# Patient Record
Sex: Female | Born: 1980 | Race: Black or African American | Hispanic: No | Marital: Single | Smoking: Current some day smoker
Health system: Southern US, Community
[De-identification: ages and names within clinical notes are randomized; demographics above are authoritative.]

## PROBLEM LIST (undated history)

## (undated) ENCOUNTER — Inpatient Hospital Stay (HOSPITAL_COMMUNITY): Payer: Self-pay

## (undated) DIAGNOSIS — E119 Type 2 diabetes mellitus without complications: Secondary | ICD-10-CM

## (undated) HISTORY — DX: Type 2 diabetes mellitus without complications: E11.9

## (undated) HISTORY — PX: LEG SURGERY: SHX1003

## (undated) HISTORY — PX: ANKLE SURGERY: SHX546

## (undated) HISTORY — PX: HIP SURGERY: SHX245

---

## 2002-06-14 DIAGNOSIS — E119 Type 2 diabetes mellitus without complications: Secondary | ICD-10-CM

## 2002-06-14 HISTORY — DX: Type 2 diabetes mellitus without complications: E11.9

## 2004-05-03 ENCOUNTER — Emergency Department (HOSPITAL_COMMUNITY): Admission: EM | Admit: 2004-05-03 | Discharge: 2004-05-03 | Payer: Self-pay | Admitting: *Deleted

## 2007-03-05 ENCOUNTER — Emergency Department (HOSPITAL_COMMUNITY): Admission: EM | Admit: 2007-03-05 | Discharge: 2007-03-05 | Payer: Self-pay | Admitting: Emergency Medicine

## 2008-08-06 ENCOUNTER — Inpatient Hospital Stay (HOSPITAL_COMMUNITY): Admission: AD | Admit: 2008-08-06 | Discharge: 2008-08-06 | Payer: Self-pay | Admitting: Obstetrics & Gynecology

## 2008-11-17 ENCOUNTER — Ambulatory Visit: Payer: Self-pay | Admitting: Advanced Practice Midwife

## 2008-11-17 ENCOUNTER — Inpatient Hospital Stay (HOSPITAL_COMMUNITY): Admission: AD | Admit: 2008-11-17 | Discharge: 2008-11-17 | Payer: Self-pay | Admitting: Obstetrics & Gynecology

## 2008-12-25 ENCOUNTER — Ambulatory Visit: Payer: Self-pay | Admitting: Obstetrics & Gynecology

## 2008-12-25 ENCOUNTER — Encounter: Payer: Self-pay | Admitting: Obstetrics and Gynecology

## 2008-12-25 LAB — CONVERTED CEMR LAB
AST: 9 units/L (ref 0–37)
Albumin: 3.3 g/dL — ABNORMAL LOW (ref 3.5–5.2)
Alkaline Phosphatase: 72 units/L (ref 39–117)
CO2: 21 meq/L (ref 19–32)
Chlamydia, DNA Probe: NEGATIVE
Creatinine, Ser: 0.75 mg/dL (ref 0.40–1.20)
Glucose, Bld: 76 mg/dL (ref 70–99)
LDH: 148 units/L (ref 94–250)
Platelets: 262 10*3/uL (ref 150–400)
Potassium: 4.3 meq/L (ref 3.5–5.3)
RDW: 13.7 % (ref 11.5–15.5)
TSH: 1.46 microintl units/mL (ref 0.350–4.500)
WBC: 10.8 10*3/uL — ABNORMAL HIGH (ref 4.0–10.5)

## 2008-12-26 ENCOUNTER — Ambulatory Visit: Payer: Self-pay | Admitting: Obstetrics & Gynecology

## 2008-12-26 ENCOUNTER — Encounter: Payer: Self-pay | Admitting: Obstetrics and Gynecology

## 2008-12-26 ENCOUNTER — Ambulatory Visit (HOSPITAL_COMMUNITY): Admission: RE | Admit: 2008-12-26 | Discharge: 2008-12-26 | Payer: Self-pay | Admitting: Obstetrics & Gynecology

## 2008-12-26 LAB — CONVERTED CEMR LAB
Collection Interval-CRCL: 24 hr
Creatinine, Urine: 170.8 mg/dL
Protein, Ur: 285 mg/24hr — ABNORMAL HIGH (ref 50–100)

## 2008-12-30 ENCOUNTER — Ambulatory Visit: Payer: Self-pay | Admitting: Obstetrics & Gynecology

## 2008-12-30 ENCOUNTER — Encounter: Payer: Self-pay | Admitting: Obstetrics and Gynecology

## 2008-12-30 ENCOUNTER — Encounter: Payer: Self-pay | Admitting: Family

## 2008-12-30 ENCOUNTER — Encounter: Admission: RE | Admit: 2008-12-30 | Discharge: 2009-01-13 | Payer: Self-pay | Admitting: Obstetrics & Gynecology

## 2008-12-30 LAB — CONVERTED CEMR LAB
Trich, Wet Prep: NONE SEEN
Yeast Wet Prep HPF POC: NONE SEEN

## 2009-01-02 ENCOUNTER — Ambulatory Visit (HOSPITAL_COMMUNITY): Admission: RE | Admit: 2009-01-02 | Discharge: 2009-01-02 | Payer: Self-pay | Admitting: Obstetrics & Gynecology

## 2009-01-02 ENCOUNTER — Ambulatory Visit: Payer: Self-pay | Admitting: Obstetrics & Gynecology

## 2009-01-06 ENCOUNTER — Ambulatory Visit: Payer: Self-pay | Admitting: Obstetrics & Gynecology

## 2009-01-09 ENCOUNTER — Ambulatory Visit (HOSPITAL_COMMUNITY): Admission: RE | Admit: 2009-01-09 | Discharge: 2009-01-09 | Payer: Self-pay | Admitting: Family Medicine

## 2009-01-09 ENCOUNTER — Ambulatory Visit: Payer: Self-pay | Admitting: Obstetrics & Gynecology

## 2009-01-13 ENCOUNTER — Ambulatory Visit: Payer: Self-pay | Admitting: Obstetrics & Gynecology

## 2009-01-16 ENCOUNTER — Ambulatory Visit: Payer: Self-pay | Admitting: Obstetrics & Gynecology

## 2009-01-20 ENCOUNTER — Inpatient Hospital Stay (HOSPITAL_COMMUNITY): Admission: AD | Admit: 2009-01-20 | Discharge: 2009-01-24 | Payer: Self-pay | Admitting: Obstetrics and Gynecology

## 2009-01-20 ENCOUNTER — Ambulatory Visit: Payer: Self-pay | Admitting: Advanced Practice Midwife

## 2009-11-19 ENCOUNTER — Emergency Department (HOSPITAL_BASED_OUTPATIENT_CLINIC_OR_DEPARTMENT_OTHER): Admission: EM | Admit: 2009-11-19 | Discharge: 2009-11-20 | Payer: Self-pay | Admitting: Emergency Medicine

## 2009-11-20 ENCOUNTER — Ambulatory Visit: Payer: Self-pay | Admitting: Diagnostic Radiology

## 2010-09-19 LAB — POCT URINALYSIS DIP (DEVICE)
Bilirubin Urine: NEGATIVE
Bilirubin Urine: NEGATIVE
Glucose, UA: NEGATIVE mg/dL
Nitrite: NEGATIVE
Protein, ur: >=300 mg/dL — AB
Specific Gravity, Urine: >=1.03 (ref 1.005–1.030)
Urobilinogen, UA: 0.2 mg/dL (ref 0.0–1.0)
pH: 6 (ref 5.0–8.0)
pH: 6.5 (ref 5.0–8.0)

## 2010-09-19 LAB — CBC
HCT: 31.4 % — ABNORMAL LOW (ref 36.0–46.0)
HCT: 32.1 % — ABNORMAL LOW (ref 36.0–46.0)
Hemoglobin: 10.7 g/dL — ABNORMAL LOW (ref 12.0–15.0)
Hemoglobin: 10.9 g/dL — ABNORMAL LOW (ref 12.0–15.0)
MCHC: 33.9 g/dL (ref 30.0–36.0)
MCHC: 34 g/dL (ref 30.0–36.0)
MCV: 97.2 fL (ref 78.0–100.0)
MCV: 98.8 fL (ref 78.0–100.0)
Platelets: 242 10*3/uL (ref 150–400)
RBC: 3.23 MIL/uL — ABNORMAL LOW (ref 3.87–5.11)
RDW: 13.6 % (ref 11.5–15.5)
RDW: 13.7 % (ref 11.5–15.5)
RDW: 13.9 % (ref 11.5–15.5)

## 2010-09-19 LAB — GLUCOSE, CAPILLARY
Glucose-Capillary: 104 mg/dL — ABNORMAL HIGH (ref 70–99)
Glucose-Capillary: 61 mg/dL — ABNORMAL LOW (ref 70–99)
Glucose-Capillary: 81 mg/dL (ref 70–99)

## 2010-09-19 LAB — COMPREHENSIVE METABOLIC PANEL
BUN: 10 mg/dL (ref 6–23)
Chloride: 108 mEq/L (ref 96–112)
GFR calc Af Amer: >60 mL/min (ref >60)
Potassium: 4 mEq/L (ref 3.5–5.1)
Sodium: 137 mEq/L (ref 135–145)

## 2010-09-19 LAB — URINALYSIS, DIPSTICK ONLY
Bilirubin Urine: NEGATIVE
Nitrite: NEGATIVE
Urobilinogen, UA: 0.2 mg/dL (ref 0.0–1.0)
pH: 6 (ref 5.0–8.0)

## 2010-09-19 LAB — RPR: RPR Ser Ql: NONREACTIVE

## 2010-09-20 LAB — POCT URINALYSIS DIP (DEVICE)
Bilirubin Urine: NEGATIVE
Bilirubin Urine: NEGATIVE
Glucose, UA: NEGATIVE mg/dL
Nitrite: NEGATIVE
Protein, ur: 100 mg/dL — AB
Protein, ur: >=300 mg/dL — AB
Specific Gravity, Urine: 1.025 (ref 1.005–1.030)
Specific Gravity, Urine: >=1.03 (ref 1.005–1.030)
Urobilinogen, UA: 0.2 mg/dL (ref 0.0–1.0)
pH: 6 (ref 5.0–8.0)

## 2010-09-21 LAB — WET PREP, GENITAL

## 2010-09-29 LAB — URINALYSIS, ROUTINE W REFLEX MICROSCOPIC
Hgb urine dipstick: NEGATIVE
Specific Gravity, Urine: 1.025 (ref 1.005–1.030)
pH: 6 (ref 5.0–8.0)

## 2010-09-29 LAB — URINE MICROSCOPIC-ADD ON

## 2012-05-02 ENCOUNTER — Encounter (HOSPITAL_BASED_OUTPATIENT_CLINIC_OR_DEPARTMENT_OTHER): Payer: Self-pay

## 2012-05-02 ENCOUNTER — Emergency Department (HOSPITAL_BASED_OUTPATIENT_CLINIC_OR_DEPARTMENT_OTHER): Payer: Medicaid Other

## 2012-05-02 ENCOUNTER — Emergency Department (HOSPITAL_BASED_OUTPATIENT_CLINIC_OR_DEPARTMENT_OTHER)
Admission: EM | Admit: 2012-05-02 | Discharge: 2012-05-03 | Disposition: A | Discharge status: Home / Self Care | Payer: Medicaid Other | Attending: Emergency Medicine | Admitting: Emergency Medicine

## 2012-05-02 DIAGNOSIS — S335XXA Sprain of ligaments of lumbar spine, initial encounter: Secondary | ICD-10-CM | POA: Insufficient documentation

## 2012-05-02 DIAGNOSIS — Y9389 Activity, other specified: Secondary | ICD-10-CM | POA: Insufficient documentation

## 2012-05-02 DIAGNOSIS — S39012A Strain of muscle, fascia and tendon of lower back, initial encounter: Secondary | ICD-10-CM

## 2012-05-02 LAB — PREGNANCY, URINE: Preg Test, Ur: NEGATIVE
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
## 2012-05-03 MED ORDER — METHOCARBAMOL 500 MG PO TABS: 500.0000 mg | ORAL_TABLET | Freq: Two times a day (BID) | ORAL | Status: DC

## 2012-05-03 MED ORDER — TRAMADOL HCL 50 MG PO TABS: 50.0000 mg | ORAL_TABLET | Freq: Four times a day (QID) | ORAL | Status: DC | PRN
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
## 2013-06-04 ENCOUNTER — Emergency Department (HOSPITAL_COMMUNITY): Payer: Medicaid Other

## 2013-06-04 ENCOUNTER — Emergency Department (HOSPITAL_COMMUNITY)
Admission: EM | Admit: 2013-06-04 | Discharge: 2013-06-04 | Disposition: A | Discharge status: Home / Self Care | Payer: Medicaid Other | Attending: Emergency Medicine | Admitting: Emergency Medicine

## 2013-06-04 ENCOUNTER — Encounter (HOSPITAL_COMMUNITY): Payer: Self-pay | Admitting: Emergency Medicine

## 2013-06-04 DIAGNOSIS — R42 Dizziness and giddiness: Secondary | ICD-10-CM | POA: Insufficient documentation

## 2013-06-04 DIAGNOSIS — B9789 Other viral agents as the cause of diseases classified elsewhere: Secondary | ICD-10-CM | POA: Insufficient documentation

## 2013-06-04 DIAGNOSIS — B349 Viral infection, unspecified: Secondary | ICD-10-CM

## 2013-06-04 DIAGNOSIS — M542 Cervicalgia: Secondary | ICD-10-CM | POA: Insufficient documentation

## 2013-06-04 DIAGNOSIS — H53149 Visual discomfort, unspecified: Secondary | ICD-10-CM | POA: Insufficient documentation

## 2013-06-04 DIAGNOSIS — J069 Acute upper respiratory infection, unspecified: Secondary | ICD-10-CM | POA: Insufficient documentation

## 2013-06-04 DIAGNOSIS — Z3202 Encounter for pregnancy test, result negative: Secondary | ICD-10-CM | POA: Insufficient documentation

## 2013-06-04 DIAGNOSIS — IMO0001 Reserved for inherently not codable concepts without codable children: Secondary | ICD-10-CM | POA: Insufficient documentation

## 2013-06-04 LAB — COMPREHENSIVE METABOLIC PANEL
Alkaline Phosphatase: 63 U/L (ref 39–117)
BUN: 6 mg/dL (ref 6–23)
Chloride: 101 mEq/L (ref 96–112)
Creatinine, Ser: 0.95 mg/dL (ref 0.50–1.10)
GFR calc Af Amer: >90 mL/min (ref >90)
Glucose, Bld: 81 mg/dL (ref 70–99)
Potassium: 3.2 mEq/L — ABNORMAL LOW (ref 3.5–5.1)
Total Bilirubin: 0.3 mg/dL (ref 0.3–1.2)
Total Protein: 7.5 g/dL (ref 6.0–8.3)

## 2013-06-04 LAB — CBC WITH DIFFERENTIAL/PLATELET
Eosinophils Absolute: 0 10*3/uL (ref 0.0–0.7)
Lymphocytes Relative: 35 % (ref 12–46)
MCHC: 33.9 g/dL (ref 30.0–36.0)
MCV: 94.3 fL (ref 78.0–100.0)
Monocytes Absolute: 0.8 10*3/uL (ref 0.1–1.0)
Neutro Abs: 2.4 10*3/uL (ref 1.7–7.7)
Platelets: 288 10*3/uL (ref 150–400)
RDW: 14.2 % (ref 11.5–15.5)
WBC: 4.9 10*3/uL (ref 4.0–10.5)

## 2013-06-04 LAB — URINALYSIS, ROUTINE W REFLEX MICROSCOPIC
Ketones, ur: 40 mg/dL — AB
Leukocytes, UA: NEGATIVE
Nitrite: NEGATIVE
Protein, ur: NEGATIVE mg/dL

## 2013-06-04 MED ORDER — SODIUM CHLORIDE 0.9 % IV BOLUS (SEPSIS): 500.0000 mL | Freq: Once | INTRAVENOUS | Status: DC

## 2013-06-04 MED ORDER — ACETAMINOPHEN 325 MG PO TABS
650.0000 mg | ORAL_TABLET | Freq: Once | ORAL | Status: AC
  Administered 2013-06-04: 650 mg via ORAL
  Filled 2013-06-04: qty 2

## 2013-06-04 MED ORDER — METOCLOPRAMIDE HCL 5 MG/ML IJ SOLN
10.0000 mg | Freq: Once | INTRAMUSCULAR | Status: AC
  Administered 2013-06-04: 10 mg via INTRAVENOUS
  Filled 2013-06-04: qty 2

## 2013-06-04 MED ORDER — KETOROLAC TROMETHAMINE 30 MG/ML IJ SOLN
30.0000 mg | Freq: Once | INTRAMUSCULAR | Status: AC
  Administered 2013-06-04: 30 mg via INTRAVENOUS
  Filled 2013-06-04: qty 1

## 2013-06-04 MED ORDER — SODIUM CHLORIDE 0.9 % IV BOLUS (SEPSIS)
1000.0000 mL | Freq: Once | INTRAVENOUS | Status: AC
  Administered 2013-06-04: 1000 mL via INTRAVENOUS

## 2013-06-04 MED ORDER — POTASSIUM CHLORIDE CRYS ER 20 MEQ PO TBCR
40.0000 meq | EXTENDED_RELEASE_TABLET | Freq: Once | ORAL | Status: AC
  Administered 2013-06-04: 40 meq via ORAL
  Filled 2013-06-04: qty 2

## 2013-06-04 NOTE — ED Notes (Signed)
Droplet precaution signed placed on door. Pt made aware staff will be wearing masks in room.

## 2013-06-04 NOTE — ED Notes (Signed)
Pt attempted to urinate. Came back to room, sts she is still unable to.

## 2013-06-04 NOTE — ED Provider Notes (Signed)
CSN: [id number]     Arrival date & time 06/04/13  0830 History   First MD Initiated Contact with Patient 06/04/13 0845     Chief Complaint  Patient presents with  . Cough  . Headache  . Neck Pain   (Consider location/radiation/quality/duration/timing/severity/associated sxs/prior Treatment) The history is provided by the patient. No language interpreter was used.  Kathleen Dixon is a 32 year old female with no significant past medical history presenting to emergency department with headache, chills, nonproductive cough, nasal congestion, generalized body aches, sneezing that started yesterday. Patient reports that the headache is localized to the right side of her head described as a throbbing sensation that is constant and has gradually gotten worse starting yesterday. Reported that she's sensitive to light. Denied radiation. Reports she's been feeling mildly dizzy with room spinning sensations. Reported that she's not been drinking as much or eating as much, reported that she is decreasing urine secondary to not drinking a lot of fluids lately. Reports she's Mucinex yesterday and her stating her congestion with minimal relief. Reports that her child at home is currently ill with nausea, vomiting and fever. Patient reports that she was feeling chills intermittently-sweats and cold. Denied neck pain, neck stiffness, nausea, vomiting, melena, hematochezia, abdominal pain, blurred vision, sudden loss of vision, chest pain, shortness of breath, difficulty breathing. PCP none  History reviewed. No pertinent past medical history. Past Surgical History  Procedure Laterality Date  . Hip surgery    . Leg surgery    . Ankle surgery     No family history on file. History  Substance Use Topics  . Smoking status: Never Smoker   . Smokeless tobacco: Not on file  . Alcohol Use: No   OB History   Grav Para Term Preterm Abortions TAB SAB Ect Mult Living                 Review of Systems   Constitutional: Positive for fever and chills. Negative for diaphoresis.  HENT: Positive for congestion. Negative for sore throat and trouble swallowing.   Eyes: Positive for photophobia. Negative for visual disturbance.  Respiratory: Positive for cough. Negative for chest tightness and shortness of breath.   Cardiovascular: Negative for chest pain.  Gastrointestinal: Negative for nausea, vomiting, abdominal pain, diarrhea, constipation, blood in stool and anal bleeding.  Genitourinary: Negative for dysuria and hematuria.  Musculoskeletal: Positive for myalgias (Generalized). Negative for neck pain and neck stiffness.  Neurological: Positive for dizziness and headaches. Negative for weakness.  All other systems reviewed and are negative.    Allergies  Review of patient's allergies indicates no known allergies.  Home Medications   Current Outpatient Rx  Name  Route  Sig  Dispense  Refill  . guaiFENesin (MUCINEX) 600 MG 12 hr tablet   Oral   Take 1,200 mg by mouth 2 (two) times daily as needed for cough or to loosen phlegm.          BP 120/93  Pulse 73  Temp(Src) 99.1 F (37.3 C) (Oral)  Resp 18  Wt 268 lb (121.564 kg)  SpO2 99%  LMP 05/28/2013 Physical Exam  Nursing note and vitals reviewed. Constitutional: She is oriented to person, place, and time. She appears well-developed and well-nourished. No distress.  HENT:  Head: Normocephalic and atraumatic.  Mouth/Throat: Oropharynx is clear and moist. No oropharyngeal exudate.  Negative swelling, erythema, inflammation, lesions, sores, taking, exudate noted to posterior oropharynx and bilateral tonsils. Uvula midline, symmetrical elevation. Negative uvular swelling. Negative trismus.  Eyes: Conjunctivae and EOM are normal. Pupils are equal, round, and reactive to light. Right eye exhibits no discharge. Left eye exhibits no discharge.  Neck: Normal range of motion. Neck supple. No tracheal deviation present.  Negative neck  stiffness Negative nuchal rigidity Negative cervical lymphadenopathy Negative pain upon palpation to C-spine  Negative Kernig's Negative Brudzinski's  Cardiovascular: Normal rate, regular rhythm and normal heart sounds.   No murmur heard. Pulses:      Radial pulses are 2+ on the right side, and 2+ on the left side.       Dorsalis pedis pulses are 2+ on the right side, and 2+ on the left side.  Pulmonary/Chest: Effort normal and breath sounds normal. No respiratory distress. She has no wheezes. She has no rales. She exhibits no tenderness.  Musculoskeletal: Normal range of motion. She exhibits no tenderness.  Full ROM to upper and lower extremities bilaterally without difficulty noted  Lymphadenopathy:    She has no cervical adenopathy.  Neurological: She is alert and oriented to person, place, and time. No cranial nerve deficit. She exhibits normal muscle tone. Coordination normal.  Cranial nerves III-XII grossly intact Strength 5+/5+ to upper and lower extremities bilaterally with resistance applied, equal distribution noted Fine motor skills intact Heel to knee down shin normal bilaterally Gait proper, proper balance - negative sway, negative drift, negative step-offs    Skin: Skin is warm and dry. No rash noted. She is not diaphoretic. No erythema.  Psychiatric: She has a normal mood and affect. Her behavior is normal.    ED Course  Procedures (including critical care time)  1:17 PM Patient walking around the hospital. Patient appears well. Patient ready to go home. Reported that her headache is improved.  Results for orders placed during the hospital encounter of 06/04/13  CBC WITH DIFFERENTIAL      Result Value Range   WBC 4.9  4.0 - 10.5 K/uL   RBC 4.04  3.87 - 5.11 MIL/uL   Hemoglobin 12.9  12.0 - 15.0 g/dL   HCT 16.1  09.6 - 04.5 %   MCV 94.3  78.0 - 100.0 fL   MCH 31.9  26.0 - 34.0 pg   MCHC 33.9  30.0 - 36.0 g/dL   RDW 40.9  81.1 - 91.4 %   Platelets 288  150 -  400 K/uL   Neutrophils Relative % 49  43 - 77 %   Neutro Abs 2.4  1.7 - 7.7 K/uL   Lymphocytes Relative 35  12 - 46 %   Lymphs Abs 1.7  0.7 - 4.0 K/uL   Monocytes Relative 16 (*) 3 - 12 %   Monocytes Absolute 0.8  0.1 - 1.0 K/uL   Eosinophils Relative 0  0 - 5 %   Eosinophils Absolute 0.0  0.0 - 0.7 K/uL   Basophils Relative 0  0 - 1 %   Basophils Absolute 0.0  0.0 - 0.1 K/uL  COMPREHENSIVE METABOLIC PANEL      Result Value Range   Sodium 136  135 - 145 mEq/L   Potassium 3.2 (*) 3.5 - 5.1 mEq/L   Chloride 101  96 - 112 mEq/L   CO2 26  19 - 32 mEq/L   Glucose, Bld 81  70 - 99 mg/dL   BUN 6  6 - 23 mg/dL   Creatinine, Ser 7.82  0.50 - 1.10 mg/dL   Calcium 8.8  8.4 - 95.6 mg/dL   Total Protein 7.5  6.0 - 8.3 g/dL  Albumin 3.6  3.5 - 5.2 g/dL   AST 16  0 - 37 U/L   ALT 11  0 - 35 U/L   Alkaline Phosphatase 63  39 - 117 U/L   Total Bilirubin 0.3  0.3 - 1.2 mg/dL   GFR calc non Af Amer 79 (*) >90 mL/min   GFR calc Af Amer >90  >90 mL/min  URINALYSIS, ROUTINE W REFLEX MICROSCOPIC      Result Value Range   Color, Urine YELLOW  YELLOW   APPearance HAZY (*) CLEAR   Specific Gravity, Urine 1.023  1.005 - 1.030   pH 6.0  5.0 - 8.0   Glucose, UA NEGATIVE  NEGATIVE mg/dL   Hgb urine dipstick NEGATIVE  NEGATIVE   Bilirubin Urine NEGATIVE  NEGATIVE   Ketones, ur 40 (*) NEGATIVE mg/dL   Protein, ur NEGATIVE  NEGATIVE mg/dL   Urobilinogen, UA 1.0  0.0 - 1.0 mg/dL   Nitrite NEGATIVE  NEGATIVE   Leukocytes, UA NEGATIVE  NEGATIVE  PREGNANCY, URINE      Result Value Range   Preg Test, Ur NEGATIVE  NEGATIVE   Dg Chest 2 View  06/04/2013   CLINICAL DATA:  Cough and dizziness  EXAM: CHEST  2 VIEW  COMPARISON:  March 05, 2007  FINDINGS: Lungs are clear. Heart size and pulmonary vascularity are normal. No adenopathy. No pneumothorax. There is mild degenerative change in the thoracic spine.  IMPRESSION: No edema or consolidation.   Electronically Signed   By: Bretta Bang M.D.   On:  06/04/2013 10:20   Labs Review Labs Reviewed  CBC WITH DIFFERENTIAL - Abnormal; Notable for the following:    Monocytes Relative 16 (*)    All other components within normal limits  COMPREHENSIVE METABOLIC PANEL - Abnormal; Notable for the following:    Potassium 3.2 (*)    GFR calc non Af Amer 79 (*)    All other components within normal limits  URINALYSIS, ROUTINE W REFLEX MICROSCOPIC - Abnormal; Notable for the following:    APPearance HAZY (*)    Ketones, ur 40 (*)    All other components within normal limits  PREGNANCY, URINE  TROPONIN I   Imaging Review Dg Chest 2 View  06/04/2013   CLINICAL DATA:  Cough and dizziness  EXAM: CHEST  2 VIEW  COMPARISON:  March 05, 2007  FINDINGS: Lungs are clear. Heart size and pulmonary vascularity are normal. No adenopathy. No pneumothorax. There is mild degenerative change in the thoracic spine.  IMPRESSION: No edema or consolidation.   Electronically Signed   By: Bretta Bang M.D.   On: 06/04/2013 10:20    EKG Interpretation    Date/Time:  Monday June 04 2013 09:50:52 EST Ventricular Rate:  71 PR Interval:  182 QRS Duration: 89 QT Interval:  399 QTC Calculation: 434 R Axis:   -31 Text Interpretation:  Sinus rhythm Left axis deviation Low voltage, precordial leads Borderline T abnormalities, anterior leads No old tracing to compare Confirmed by GOLDSTON  MD, SCOTT ([id number]) on 06/04/2013 10:18:25 AM            MDM   1. Viral illness   2. URI (upper respiratory infection)     Medications  sodium chloride 0.9 % bolus 500 mL (not administered)  sodium chloride 0.9 % bolus 1,000 mL (0 mLs Intravenous Stopped 06/04/13 1100)  potassium chloride SA (K-DUR,KLOR-CON) CR tablet 40 mEq (40 mEq Oral Given 06/04/13 1041)  ketorolac (TORADOL) 30 MG/ML injection 30 mg (  30 mg Intravenous Given 06/04/13 1048)  acetaminophen (TYLENOL) tablet 650 mg (650 mg Oral Given 06/04/13 1047)  sodium chloride 0.9 % bolus 1,000 mL (1,000 mLs  Intravenous New Bag/Given 06/04/13 1240)  metoCLOPramide (REGLAN) injection 10 mg (10 mg Intravenous Given 06/04/13 1243)   Filed Vitals:   06/04/13 1130 06/04/13 1230 06/04/13 1241 06/04/13 1300  BP:   120/93   Pulse: 71 74 69 73  Temp:      TempSrc:      Resp:      Weight:      SpO2: 100% 100% 100% 99%    Patient presenting to emergency department with headache, chills, nonproductive cough, nasal congestion, generalized bodyaches, subjective fever that's been ongoing since yesterday. Reported that her child is ill with nausea, vomiting, and fever. Denied flu vaccine. Alert and oriented. GCS 15. Heart rate and rhythm normal. Pulses palpable strong, radial and DP 2+ bilaterally. Lungs clear to auscultation to upper and lower lobes bilaterally. Discomfort upon palpation to the right frontal and maxillary region. Negative nystagmus. Negative nuchal rigidity, negative pain upon palpation to the neck and C-spine. Negative Kernig's, negative Brudzinski's sign. Full range of motion to upper lower extremities bilaterally. Strength intact with equal distribution bilaterally. Cranial nerves grossly intact. Negative focal neurological deficits. Obese. CBC negative findings. CMP noted hypokalemia of 3.2-potassium by mouth administered. Urine pregnancy negative. Urinalysis negative for nitrites and leukocytes-negative for infection. Chest x-ray negative for acute abnormalities, negative findings for pneumonia. Doubt ICH. Doubt SAH. Doubt meningitis. Fever controlled in ED setting. Headache has improved with medications and IV fluids. Suspicion to be viral syndrome-upper respiratory infection. Patient stable, afebrile. Discharged patient. Discussed with patient to rest, stay hydrated. Referred patient to urgent care Center. Discussed with patient to take Tylenol as needed for discomfort. Discussed with patient to do warm soaks. Discussed with patient to closely monitor symptoms and if symptoms are to worsen or  change report back to emergency department - strict return structures given. Patient agreed to plan of care, understood, all questions answered.   Raymon Mutton, PA-C 06/04/13 [phone]

## 2013-06-04 NOTE — ED Notes (Signed)
Pt is here with headache, neck pain, fever since yesterday.  Aches all over

## 2013-06-04 NOTE — ED Notes (Signed)
Pt reports she is ready to leave. Marissa, PA made aware.

## 2013-06-04 NOTE — ED Notes (Signed)
Pt reports yesterday she got a HA then progressively started to feel worse with complaints of fever, body aches, cough, nasal congestion and runny nose, also having diarrhea. Denies n/v. Denies getting flu shot this year. C/o decreased appetite. Pt sts she took Mucinex yesterday, no relief. Nad, skin warm and dry, resp e/u.
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
## 2013-06-04 NOTE — ED Notes (Addendum)
Pt refusing in and out cath. Reports she doesn't feel good already and that's not going to help. Pt explained the importance of seeing if there is an infection in urine. Pt took urine specimen cup and sts she will try again.

## 2013-06-04 NOTE — ED Notes (Signed)
Pharmacy tech at bedside 

## 2013-06-04 NOTE — ED Notes (Signed)
Pt returned from radiology.

## 2013-06-04 NOTE — ED Notes (Signed)
Pt refusing last set of vitals

## 2013-06-04 NOTE — ED Notes (Signed)
Gave pt urine specimen cup to attempt to urinate. Pt ambulated to restroom with no issues.

## 2013-06-04 NOTE — ED Notes (Signed)
Pt reminded need for urine sample.  

## 2013-06-04 NOTE — ED Notes (Signed)
Pt returned from bathroom, reports she was unable to urinate.

## 2013-06-05 NOTE — ED Provider Notes (Signed)
Medical screening examination/treatment/procedure(s) were performed by non-physician practitioner and as supervising physician I was immediately available for consultation/collaboration.  EKG Interpretation    Date/Time:  Monday June 04 2013 09:50:52 EST Ventricular Rate:  71 PR Interval:  182 QRS Duration: 89 QT Interval:  399 QTC Calculation: 434 R Axis:   -31 Text Interpretation:  Sinus rhythm Left axis deviation Low voltage, precordial leads Borderline T abnormalities, anterior leads No old tracing to compare Confirmed by Lester Platas  MD, Arbadella Kimbler ([id number]) on 06/04/2013 10:18:25 AM              Audree Camel, MD 06/05/13 [phone]

## 2013-07-25 ENCOUNTER — Ambulatory Visit: Payer: Self-pay | Admitting: Obstetrics

## 2013-10-25 ENCOUNTER — Encounter: Payer: Self-pay | Admitting: Obstetrics

## 2013-10-25 ENCOUNTER — Ambulatory Visit (INDEPENDENT_AMBULATORY_CARE_PROVIDER_SITE_OTHER): Payer: Medicaid Other | Admitting: Obstetrics

## 2013-10-25 VITALS — BP 127/84 | HR 78 | Temp 98.7°F | Ht 70.0 in | Wt 309.0 lb

## 2013-10-25 DIAGNOSIS — Z0000 Encounter for general adult medical examination without abnormal findings: Secondary | ICD-10-CM

## 2013-10-25 DIAGNOSIS — B373 Candidiasis of vulva and vagina: Secondary | ICD-10-CM

## 2013-10-25 DIAGNOSIS — B9689 Other specified bacterial agents as the cause of diseases classified elsewhere: Secondary | ICD-10-CM | POA: Insufficient documentation

## 2013-10-25 DIAGNOSIS — N760 Acute vaginitis: Secondary | ICD-10-CM | POA: Insufficient documentation

## 2013-10-25 DIAGNOSIS — B3731 Acute candidiasis of vulva and vagina: Secondary | ICD-10-CM | POA: Insufficient documentation

## 2013-10-25 DIAGNOSIS — M5432 Sciatica, left side: Secondary | ICD-10-CM | POA: Insufficient documentation

## 2013-10-25 MED ORDER — NORGESTIMATE-ETH ESTRADIOL 0.25-35 MG-MCG PO TABS: 1.0000 | ORAL_TABLET | Freq: Every day | ORAL | Status: DC

## 2013-10-25 MED ORDER — CYCLOBENZAPRINE HCL 10 MG PO TABS: 10.0000 mg | ORAL_TABLET | Freq: Three times a day (TID) | ORAL | Status: DC | PRN

## 2013-10-25 MED ORDER — HYDROCODONE-ACETAMINOPHEN 7.5-325 MG PO TABS: 1.0000 | ORAL_TABLET | Freq: Four times a day (QID) | ORAL | Status: DC | PRN

## 2013-10-25 MED ORDER — IBUPROFEN 800 MG PO TABS: 800.0000 mg | ORAL_TABLET | Freq: Three times a day (TID) | ORAL | Status: DC | PRN

## 2013-10-25 MED ORDER — FLUCONAZOLE 150 MG PO TABS: 150.0000 mg | ORAL_TABLET | Freq: Once | ORAL | Status: DC

## 2013-10-25 MED ORDER — METRONIDAZOLE 500 MG PO TABS: 500.0000 mg | ORAL_TABLET | Freq: Two times a day (BID) | ORAL | Status: DC

## 2013-10-25 NOTE — Progress Notes (Signed)
Subjective:     Kathleen Dixon is a 33 y.o. female here for a routine exam.  Current complaints: None.    Personal health questionnaire:  Is patient Kathleen Dixon, have a family history of breast and/or ovarian cancer: no Is there a family history of uterine cancer diagnosed at age < 2750, gastrointestinal cancer, urinary tract cancer, family member who is a Personnel officerLynch syndrome-associated carrier: no Is the patient overweight and hypertensive, family history of diabetes, personal history of gestational diabetes or PCOS: no Is patient over 5955, have PCOS,  family history of premature CHD under age 32, diabetes, smoke, have hypertension or peripheral artery disease:  no  The HPI was reviewed and explored in further detail by the provider. Gynecologic History Patient's last menstrual period was 10/11/2013. Contraception: none Last Pap: 2013 . Results were: normal Last mammogram: n/a. Results were: n/a  Obstetric History OB History  Gravida Para Term Preterm AB SAB TAB Ectopic Multiple Living  1 1 1  0 0 0 0 0 0 1    # Outcome Date GA Lbr Len/2nd Weight Sex Delivery Anes PTL Lv  1 TRM 01/22/09 [id number]  6 lb 9 oz (2.977 kg) F SVD EPI  Y      History reviewed. No pertinent past medical history.  Past Surgical History  Procedure Laterality Date  . Hip surgery    . Leg surgery    . Ankle surgery      Current outpatient prescriptions:fluconazole (DIFLUCAN) 150 MG tablet, Take 1 tablet (150 mg total) by mouth once., Disp: 1 tablet, Rfl: 2;  metroNIDAZOLE (FLAGYL) 500 MG tablet, Take 1 tablet (500 mg total) by mouth 2 (two) times daily., Disp: 14 tablet, Rfl: 2 No Known Allergies  History  Substance Use Topics  . Smoking status: Current Some Day Smoker  . Smokeless tobacco: Never Used  . Alcohol Use: No    Family History  Problem Relation Age of Onset  . Diabetes Mother   . Hypertension Mother   . Diabetes Father   . Hypertension Father       Review of Systems  Constitutional:  negative for fatigue and weight loss Respiratory: negative for cough and wheezing Cardiovascular: negative for chest pain, fatigue and palpitations Gastrointestinal: negative for abdominal pain and change in bowel habits Musculoskeletal:negative for myalgias Neurological: negative for gait problems and tremors Behavioral/Psych: negative for abusive relationship, depression Endocrine: negative for temperature intolerance   Genitourinary:negative for abnormal menstrual periods, genital lesions, hot flashes, sexual problems and vaginal discharge Integument/breast: negative for breast lump, breast tenderness, nipple discharge and skin lesion(s)    Objective:       BP 127/84  Pulse 78  Temp(Src) 98.7 F (37.1 C)  Ht 5\' 10"  (1.778 m)  Wt 309 lb (140.161 kg)  BMI 44.34 kg/m2  LMP 10/11/2013 General:   alert  Skin:   no rash or abnormalities  Lungs:   clear to auscultation bilaterally  Heart:   regular rate and rhythm, S1, S2 normal, no murmur, click, rub or gallop  Breasts:   normal without suspicious masses, skin or nipple changes or axillary nodes  Abdomen:  normal findings: no organomegaly, soft, non-tender and no hernia  Pelvis:  External genitalia: normal general appearance Urinary system: urethral meatus normal and bladder without fullness, nontender Vaginal: normal without tenderness, induration or masses Cervix: normal appearance Adnexa: normal bimanual exam Uterus: anteverted and non-tender, normal size   Lab Review Urine pregnancy test Labs reviewed yes Radiologic studies reviewed no  Assessment:    Healthy female exam.   Obesity   Plan:    Education reviewed: low fat, low cholesterol diet, safe sex/STD prevention, self breast exams, smoking cessation and weight bearing exercise. Contraception: none. Follow up in: 1 year.   Meds ordered this encounter  Medications  . DISCONTD: cyclobenzaprine (FLEXERIL) 10 MG tablet    Sig: Take 1 tablet (10 mg total) by  mouth every 8 (eight) hours as needed for muscle spasms.    Dispense:  30 tablet    Refill:  1  . DISCONTD: HYDROcodone-acetaminophen (NORCO) 7.5-325 MG per tablet    Sig: Take 1 tablet by mouth every 6 (six) hours as needed for moderate pain.    Dispense:  30 tablet    Refill:  0  . DISCONTD: ibuprofen (ADVIL,MOTRIN) 800 MG tablet    Sig: Take 1 tablet (800 mg total) by mouth every 8 (eight) hours as needed.    Dispense:  30 tablet    Refill:  5  . DISCONTD: norgestimate-ethinyl estradiol (ORTHO-CYCLEN,SPRINTEC,PREVIFEM) 0.25-35 MG-MCG tablet    Sig: Take 1 tablet by mouth daily.    Dispense:  1 Package    Refill:  11  . metroNIDAZOLE (FLAGYL) 500 MG tablet    Sig: Take 1 tablet (500 mg total) by mouth 2 (two) times daily.    Dispense:  14 tablet    Refill:  2  . fluconazole (DIFLUCAN) 150 MG tablet    Sig: Take 1 tablet (150 mg total) by mouth once.    Dispense:  1 tablet    Refill:  2   Orders Placed This Encounter  Procedures  . WET PREP BY MOLECULAR PROBE  . GC/Chlamydia Probe Amp

## 2013-10-26 ENCOUNTER — Emergency Department (INDEPENDENT_AMBULATORY_CARE_PROVIDER_SITE_OTHER)
Admission: EM | Admit: 2013-10-26 | Discharge: 2013-10-26 | Disposition: A | Discharge status: Home / Self Care | Payer: Medicaid Other | Source: Home / Self Care | Attending: Family Medicine | Admitting: Family Medicine

## 2013-10-26 ENCOUNTER — Encounter (HOSPITAL_COMMUNITY): Payer: Self-pay | Admitting: Emergency Medicine

## 2013-10-26 DIAGNOSIS — S51011A Laceration without foreign body of right elbow, initial encounter: Secondary | ICD-10-CM

## 2013-10-26 DIAGNOSIS — S51009A Unspecified open wound of unspecified elbow, initial encounter: Secondary | ICD-10-CM

## 2013-10-26 DIAGNOSIS — Y92009 Unspecified place in unspecified non-institutional (private) residence as the place of occurrence of the external cause: Secondary | ICD-10-CM

## 2013-10-26 DIAGNOSIS — Y9389 Activity, other specified: Secondary | ICD-10-CM

## 2013-10-26 LAB — GC/CHLAMYDIA PROBE AMP
CT Probe RNA: NEGATIVE
GC Probe RNA: NEGATIVE

## 2013-10-26 LAB — PAP IG W/ RFLX HPV ASCU

## 2013-10-26 LAB — WET PREP BY MOLECULAR PROBE
CANDIDA SPECIES: NEGATIVE
Gardnerella vaginalis: POSITIVE — AB
Trichomonas vaginosis: NEGATIVE

## 2013-10-26 NOTE — ED Notes (Addendum)
C/o right elbow laceration which happened today while at work patient hit elbow on bathroom while she was cleaning up States she clean area.

## 2013-10-26 NOTE — Discharge Instructions (Signed)

## 2013-10-30 NOTE — ED Provider Notes (Signed)
Medical screening examination/treatment/procedure(s) were performed by resident physician or non-physician practitioner and as supervising physician I was immediately available for consultation/collaboration.   Barkley BrunsKINDL,Noah Lembke DOUGLAS MD.   Linna HoffJames D Yovani Cogburn, MD 10/30/13 [phone]

## 2013-10-30 NOTE — ED Provider Notes (Signed)
CSN: [id number]     Arrival date & time 10/26/13  1931 History   First MD Initiated Contact with Patient 10/26/13 2023     Chief Complaint  Patient presents with  . Laceration   (Consider location/radiation/quality/duration/timing/severity/associated sxs/prior Treatment) HPI Comments: Was cleaning her bathroom at home today and hit right elbow on the edge of a piece of glass and has small laceration. Last tetanus in 2013.  No active bleeding. PCP: none  Patient is a 33 y.o. female presenting with skin laceration. The history is provided by the patient.  Laceration   History reviewed. No pertinent past medical history. Past Surgical History  Procedure Laterality Date  . Hip surgery    . Leg surgery    . Ankle surgery     Family History  Problem Relation Age of Onset  . Diabetes Mother   . Hypertension Mother   . Diabetes Father   . Hypertension Father    History  Substance Use Topics  . Smoking status: Current Some Day Smoker  . Smokeless tobacco: Never Used  . Alcohol Use: No   OB History   Grav Para Term Preterm Abortions TAB SAB Ect Mult Living   1 1 1  0 0 0 0 0 0 1     Review of Systems  All other systems reviewed and are negative.   Allergies  Review of patient's allergies indicates no known allergies.  Home Medications   Prior to Admission medications   Medication Sig Start Date End Date Taking? Authorizing Provider  fluconazole (DIFLUCAN) 150 MG tablet Take 1 tablet (150 mg total) by mouth once. 10/25/13   Brock Badharles A Harper, MD  metroNIDAZOLE (FLAGYL) 500 MG tablet Take 1 tablet (500 mg total) by mouth 2 (two) times daily. 10/25/13   Brock Badharles A Harper, MD   BP 133/84  Pulse 76  Temp(Src) 98.7 F (37.1 C) (Oral)  Resp 18  SpO2 99%  LMP 10/11/2013 Physical Exam  Nursing note and vitals reviewed. Constitutional: She is oriented to person, place, and time. She appears well-developed and well-nourished. No distress.  HENT:  Head: Normocephalic and  atraumatic.  Eyes: Conjunctivae are normal. No scleral icterus.  Cardiovascular: Normal rate.   Pulmonary/Chest: Effort normal.  Musculoskeletal: Normal range of motion.  Neurological: She is alert and oriented to person, place, and time.  Skin: Skin is warm and dry.  1.5 cm superficial linear laceration at right elbow.   Psychiatric: She has a normal mood and affect. Her behavior is normal.    ED Course  LACERATION REPAIR Date/Time: 10/30/2013 10:04 AM Performed by: Lemmie EvensPRESSON, Celia Gibbons LEE Authorized by: Lemmie EvensPRESSON, Ishmel Acevedo LEE Consent: Verbal consent obtained. Risks and benefits: risks, benefits and alternatives were discussed Consent given by: patient Patient understanding: patient states understanding of the procedure being performed Patient identity confirmed: verbally with patient Time out: Immediately prior to procedure a "time out" was called to verify the correct patient, procedure, equipment, support staff and site/side marked as required. Body area: upper extremity Location details: right elbow Laceration length: 1.5 cm Foreign bodies: no foreign bodies Tendon involvement: none Nerve involvement: none Vascular damage: no Patient sedated: no Preparation: Patient was prepped and draped in the usual sterile fashion. Irrigation solution: saline Irrigation method: syringe Amount of cleaning: standard Debridement: none Skin closure: glue Approximation: close Approximation difficulty: simple Patient tolerance: Patient tolerated the procedure well with no immediate complications.   (including critical care time) Labs Review Labs Reviewed - No data to display  Imaging Review No  results found.   MDM   1. Laceration of skin of right elbow   Patient advised regarding skin adhesive wound care at home. Follow up prn.    Ardis RowanJennifer Lee Taeden Geller, PA 10/30/13 1005

## 2013-11-01 ENCOUNTER — Telehealth: Payer: Self-pay | Admitting: *Deleted

## 2013-11-01 NOTE — Telephone Encounter (Signed)
Patient left a message requesting pap smear results. Reviewed lab results with patient. Made patient aware copy of lab results has been mailed to her and a prescription has been sent to her pharmacy .

## 2013-12-17 ENCOUNTER — Telehealth: Payer: Self-pay | Admitting: *Deleted

## 2013-12-17 DIAGNOSIS — L02229 Furuncle of trunk, unspecified: Secondary | ICD-10-CM

## 2013-12-17 MED ORDER — AMOXICILLIN-POT CLAVULANATE 875-125 MG PO TABS: 1.0000 | ORAL_TABLET | Freq: Two times a day (BID) | ORAL | Status: DC

## 2013-12-17 NOTE — Telephone Encounter (Signed)
Patient called stating she needed a Rx for an antibiotic for a cyst. Patient stated Dr. Clearance CootsHarper stated anytime she got a cyst to call and he would send her in an Rx. I contacted the patient to find out what kind of cyst. Patient states its a bump on her vagina from shaving. I told her we had no record of what kind of antibiotic he had given her before that I would have to discuss it with Dr. Clearance CootsHarper on Monday and would give her a call back.

## 2014-04-15 ENCOUNTER — Encounter (HOSPITAL_COMMUNITY): Payer: Self-pay | Admitting: Emergency Medicine

## 2014-10-13 ENCOUNTER — Emergency Department (HOSPITAL_COMMUNITY)
Admission: EM | Admit: 2014-10-13 | Discharge: 2014-10-13 | Disposition: A | Discharge status: Home / Self Care | Payer: Medicaid Other | Attending: Emergency Medicine | Admitting: Emergency Medicine

## 2014-10-13 ENCOUNTER — Encounter (HOSPITAL_COMMUNITY): Payer: Self-pay | Admitting: Emergency Medicine

## 2014-10-13 DIAGNOSIS — Z720 Tobacco use: Secondary | ICD-10-CM | POA: Diagnosis not present

## 2014-10-13 DIAGNOSIS — R112 Nausea with vomiting, unspecified: Secondary | ICD-10-CM | POA: Insufficient documentation

## 2014-10-13 DIAGNOSIS — R1084 Generalized abdominal pain: Secondary | ICD-10-CM | POA: Diagnosis not present

## 2014-10-13 DIAGNOSIS — Z79899 Other long term (current) drug therapy: Secondary | ICD-10-CM | POA: Diagnosis not present

## 2014-10-13 DIAGNOSIS — R197 Diarrhea, unspecified: Secondary | ICD-10-CM | POA: Diagnosis not present

## 2014-10-13 DIAGNOSIS — Z792 Long term (current) use of antibiotics: Secondary | ICD-10-CM | POA: Insufficient documentation

## 2014-10-13 DIAGNOSIS — R000 Tachycardia, unspecified: Secondary | ICD-10-CM | POA: Diagnosis not present

## 2014-10-13 LAB — COMPREHENSIVE METABOLIC PANEL
ALBUMIN: 3.3 g/dL — AB (ref 3.5–5.0)
ALK PHOS: 50 U/L (ref 38–126)
ALT: 15 U/L (ref 14–54)
AST: 20 U/L (ref 15–41)
Anion gap: 10 (ref 5–15)
BUN: 9 mg/dL (ref 6–20)
CALCIUM: 8.3 mg/dL — AB (ref 8.9–10.3)
CO2: 20 mmol/L — ABNORMAL LOW (ref 22–32)
Chloride: 107 mmol/L (ref 101–111)
Creatinine, Ser: 0.95 mg/dL (ref 0.44–1.00)
GFR calc Af Amer: >60 mL/min (ref >60)
GFR calc non Af Amer: >60 mL/min (ref >60)
Glucose, Bld: 105 mg/dL — ABNORMAL HIGH (ref 70–99)
POTASSIUM: 3.8 mmol/L (ref 3.5–5.1)
Sodium: 137 mmol/L (ref 135–145)
TOTAL PROTEIN: 6.2 g/dL — AB (ref 6.5–8.1)
Total Bilirubin: 0.9 mg/dL (ref 0.3–1.2)

## 2014-10-13 LAB — CBC WITH DIFFERENTIAL/PLATELET
BASOS ABS: 0 10*3/uL (ref 0.0–0.1)
Basophils Relative: 0 % (ref 0–1)
Eosinophils Absolute: 0 10*3/uL (ref 0.0–0.7)
Eosinophils Relative: 0 % (ref 0–5)
HEMATOCRIT: 37.6 % (ref 36.0–46.0)
Hemoglobin: 12.8 g/dL (ref 12.0–15.0)
LYMPHS PCT: 6 % — AB (ref 12–46)
Lymphs Abs: 0.7 10*3/uL (ref 0.7–4.0)
MCH: 31.8 pg (ref 26.0–34.0)
MCHC: 34 g/dL (ref 30.0–36.0)
MCV: 93.5 fL (ref 78.0–100.0)
MONO ABS: 0.3 10*3/uL (ref 0.1–1.0)
Monocytes Relative: 3 % (ref 3–12)
Neutro Abs: 10.2 10*3/uL — ABNORMAL HIGH (ref 1.7–7.7)
Neutrophils Relative %: 91 % — ABNORMAL HIGH (ref 43–77)
Platelets: 258 10*3/uL (ref 150–400)
RBC: 4.02 MIL/uL (ref 3.87–5.11)
RDW: 14.6 % (ref 11.5–15.5)
WBC: 11.2 10*3/uL — ABNORMAL HIGH (ref 4.0–10.5)

## 2014-10-13 LAB — LIPASE, BLOOD: LIPASE: 41 U/L (ref 22–51)

## 2014-10-13 MED ORDER — ONDANSETRON 4 MG PO TBDP: 4.0000 mg | ORAL_TABLET | Freq: Once | ORAL | Status: DC

## 2014-10-13 NOTE — ED Notes (Signed)
C/o generalized abd pain, headache, nausea, vomiting, and diarrhea since around 1pm.  Reports "brown colored" blood in emesis.

## 2014-10-13 NOTE — ED Notes (Addendum)
Offered patient fluids to drink, pt stated "is that all you're going to do for me? I can do that at home!"  Informed patient that because labs were normal, vitals were normal and did not indicate dehydration, that if she could tolerate po fluids that she was safe to go home.  Pt stated she wanted to go home, refused to provide urine sample.  Dr Delford Fieldwright aware.

## 2014-10-13 NOTE — ED Provider Notes (Signed)
CSN: [id number]     Arrival date & time 10/13/14  2050 History   First MD Initiated Contact with Patient 10/13/14 2128     Chief Complaint  Patient presents with  . Abdominal Pain  . Emesis   (Consider location/radiation/quality/duration/timing/severity/associated sxs/prior Treatment) Patient is a 34 y.o. female presenting with vomiting. The history is provided by the patient. No language interpreter was used.  Emesis Severity:  Mild Timing:  Intermittent Number of daily episodes:  3+ Quality:  Stomach contents Progression:  Unchanged Chronicity:  New Recent urination:  Normal Relieved by:  None tried Worsened by:  Nothing tried Ineffective treatments:  None tried Associated symptoms: chills and diarrhea   Associated symptoms: no abdominal pain, no cough, no fever, no headaches, no myalgias, no sore throat and no URI   Risk factors: no alcohol use, no diabetes, no prior abdominal surgery, no sick contacts, no suspect food intake and no travel to endemic areas     History reviewed. No pertinent past medical history. Past Surgical History  Procedure Laterality Date  . Hip surgery    . Leg surgery    . Ankle surgery     Family History  Problem Relation Age of Onset  . Diabetes Mother   . Hypertension Mother   . Diabetes Father   . Hypertension Father    History  Substance Use Topics  . Smoking status: Current Some Day Smoker  . Smokeless tobacco: Never Used  . Alcohol Use: Yes   OB History    Gravida Para Term Preterm AB TAB SAB Ectopic Multiple Living   0 0 0 0 0 0 1     Review of Systems  Constitutional: Positive for chills. Negative for diaphoresis and fatigue.  HENT: Negative for sore throat.   Respiratory: Negative for cough, chest tightness and shortness of breath.   Cardiovascular: Negative for chest pain.  Gastrointestinal: Positive for nausea, vomiting and diarrhea. Negative for abdominal pain, blood in stool and abdominal distention.   Genitourinary: Negative for dysuria, flank pain and difficulty urinating.  Musculoskeletal: Negative for myalgias.  Neurological: Negative for weakness, light-headedness and headaches.  Psychiatric/Behavioral: Negative for confusion.  All other systems reviewed and are negative.    Allergies  Review of patient's allergies indicates no known allergies.  Home Medications   Prior to Admission medications   Medication Sig Start Date End Date Taking? Authorizing Provider  amoxicillin-clavulanate (AUGMENTIN) 875-125 MG per tablet Take 1 tablet by mouth 2 (two) times daily. 12/17/13   Brock Bad, MD  fluconazole (DIFLUCAN) 150 MG tablet Take 1 tablet (150 mg total) by mouth once. 10/25/13   Brock Bad, MD  metroNIDAZOLE (FLAGYL) 500 MG tablet Take 1 tablet (500 mg total) by mouth 2 (two) times daily. 10/25/13   Brock Bad, MD   BP 126/91 mmHg  Pulse 102  Temp(Src) 99.6 F (37.6 C) (Oral)  Resp 22  Ht  (1.778 m)  Wt 316 lb (143.337 kg)  BMI 45.34 kg/m2  SpO2 99%  LMP 09/15/2014   Physical Exam  Constitutional: She is oriented to person, place, and time. She appears well-developed and well-nourished. She is active.  Non-toxic appearance. No distress.  HENT:  Head: Normocephalic and atraumatic.  Nose: Nose normal.  Mouth/Throat: Oropharynx is clear and moist. No oropharyngeal exudate.  Eyes: EOM are normal. Pupils are equal, round, and reactive to light.  Neck: Normal range of motion. Neck supple.  Cardiovascular: Regular rhythm, normal heart sounds  and intact distal pulses.  Tachycardia present.   No murmur heard. Pulmonary/Chest: Effort normal and breath sounds normal. No respiratory distress. She has no wheezes. She exhibits no tenderness.  Abdominal: Soft. There is no tenderness. There is no rebound and no guarding.  Diffusely nontender, able to change positions easily, no guarding   Musculoskeletal: Normal range of motion. She exhibits no tenderness.   Lymphadenopathy:    She has no cervical adenopathy.  Neurological: She is alert and oriented to person, place, and time. No cranial nerve deficit. Coordination normal.  Skin: Skin is warm and dry. She is not diaphoretic.  Psychiatric: She has a normal mood and affect. Her behavior is normal. Judgment and thought content normal.  Nursing note and vitals reviewed.   ED Course  Procedures (including critical care time) Labs Review Labs Reviewed  CBC WITH DIFFERENTIAL/PLATELET - Abnormal; Notable for the following:    WBC 11.2 (*)    Neutrophils Relative % 91 (*)    Neutro Abs 10.2 (*)    Lymphocytes Relative 6 (*)    All other components within normal limits  COMPREHENSIVE METABOLIC PANEL - Abnormal; Notable for the following:    CO2 20 (*)    Glucose, Bld 105 (*)    Calcium 8.3 (*)    Total Protein 6.2 (*)    Albumin 3.3 (*)    All other components within normal limits  LIPASE, BLOOD  URINALYSIS, ROUTINE W REFLEX MICROSCOPIC  POC URINE PREG, ED   Imaging Review No results found.   EKG Interpretation None      MDM   Final diagnoses:  Nausea vomiting and diarrhea  Generalized abdominal pain   Pt is [age] F with no significant PMH who presents with generalized abd pain, N/V/D x 1 day.  Reports mild nausea this AM that progressively worsened.  Started with NBNB emesis then after several episodes of vomiting, she developed scant blood tinged vomitus.  She also has had several episodes of loose stool today and gradual onset of a mild diffuse headache.  Headache is throbbing in nature and diffuse.  She told her mom about her sx, and her mom encouraged her to present to the ED.  She denies any abd pain, has no known fevers at home, and no flank/back pain.    Well appearing, in NAD.  Vitals stable.  Feels warm but has an oral temp of 99.  No tachycardia, good skin turgor, and moist oral mucosa.  No signs of clinical dehydration.  Abdomen is obese but soft and with no guarding.     Will give zofran ODT and encourage fluids.  She had labs drawn in triage and will follow up a urine and UPT.    Able to tolerate PO intake after zofran.   At 2230, patient became somewhat frustrated that she was not going to have any imaging done.  She was advised that based on her benign exam and lack of abdominal pain, it was very unlikely that any imaging would be beneficial at this time.  Most likely her sx are due to a viral gastroenteritis.  She was given zofran on arrival and has not had any episodes of diarrhea or emesis since presenting, and is tolerating PO intake.  She was advised on symptomatic care and encouraged to stay well hydrated.  At this time patient requested discharge home.  Both myself and Dr. Patria Maneampos had discussions with patient and family to better understand her frustrations and explain our thought process.  She was encouraged to provide a urine sample prior to discharge but stated that she did not want to stay to provide a sample.  She was advised that we wish to look for urine infection and UPT to ensure that those are not the cause of her nausea/vomiting, but patient again declined.  All questions from the patient and family were answered.  I believe that despite not completing our work up to ensure that she is not pregnant, I otherwise have little concerns based on the patient's well appearance.  She was given a Rx for zofran, encouraged to stay well hydrated, and advised to return if she is unable to keep fluids down despite antiemetics.  Advised tylenol for fever control.  Discharged home in stable condition.    Patient was seen with ED Attending, Dr. Durene Romans, MD      Lenell Antu, MD 10/14/14 0001  Azalia Bilis, MD 10/14/14 [phone]

## 2014-10-13 NOTE — ED Notes (Signed)
Went to discharge patient, pt, family, and belongings no longer in room

## 2017-09-29 ENCOUNTER — Ambulatory Visit: Payer: Self-pay | Admitting: Obstetrics

## 2017-10-07 ENCOUNTER — Ambulatory Visit: Payer: Self-pay | Admitting: Obstetrics

## 2017-10-14 ENCOUNTER — Ambulatory Visit: Payer: Self-pay | Admitting: Obstetrics

## 2018-05-08 ENCOUNTER — Encounter: Payer: Self-pay | Admitting: Obstetrics

## 2018-05-08 ENCOUNTER — Ambulatory Visit (INDEPENDENT_AMBULATORY_CARE_PROVIDER_SITE_OTHER): Payer: Medicaid Other | Admitting: Obstetrics

## 2018-05-08 ENCOUNTER — Other Ambulatory Visit (HOSPITAL_COMMUNITY)
Admission: RE | Admit: 2018-05-08 | Discharge: 2018-05-08 | Disposition: A | Discharge status: Home / Self Care | Payer: Medicaid Other | Source: Ambulatory Visit | Attending: Obstetrics | Admitting: Obstetrics

## 2018-05-08 VITALS — BP 130/88 | HR 72 | Wt 324.0 lb

## 2018-05-08 DIAGNOSIS — Z3481 Encounter for supervision of other normal pregnancy, first trimester: Secondary | ICD-10-CM

## 2018-05-08 DIAGNOSIS — Z3491 Encounter for supervision of normal pregnancy, unspecified, first trimester: Secondary | ICD-10-CM

## 2018-05-08 DIAGNOSIS — Z3490 Encounter for supervision of normal pregnancy, unspecified, unspecified trimester: Secondary | ICD-10-CM | POA: Insufficient documentation

## 2018-05-08 NOTE — Progress Notes (Signed)
Pt presents for New OB. Pt states that this was not a planned pregnancy. She does reside with FOB

## 2018-05-08 NOTE — Progress Notes (Signed)
Subjective:    Kathleen Dixon is being seen today for her first obstetrical visit.  This is not a planned pregnancy. She is at 5785w1d gestation. Her obstetrical history is significant for obesity and smoker. Relationship with FOB: significant other, living together. Patient does intend to breast feed. Pregnancy history fully reviewed.  The information documented in the HPI was reviewed and verified.  Menstrual History: OB History    Gravida  2   Para  1   Term  1   Preterm  0   AB  0   Living  1     SAB  0   TAB  0   Ectopic  0   Multiple  0   Live Births  1            Patient's last menstrual period was 02/12/2018.    Past Medical History:  Diagnosis Date  . Diabetes (HCC) 2004    Past Surgical History:  Procedure Laterality Date  . ANKLE SURGERY    . HIP SURGERY    . LEG SURGERY       (Not in a hospital admission) No Known Allergies  Social History   Tobacco Use  . Smoking status: Current Some Day Smoker  . Smokeless tobacco: Never Used  . Tobacco comment: not since confirmed pregnancy  Substance Use Topics  . Alcohol use: Not Currently    Comment: not since confirmed pregnancy    Family History  Problem Relation Age of Onset  . Diabetes Mother   . Hypertension Mother   . Diabetes Father   . Hypertension Father      Review of Systems Constitutional: negative for weight loss Gastrointestinal: negative for vomiting Genitourinary:negative for genital lesions and vaginal discharge and dysuria Musculoskeletal:negative for back pain Behavioral/Psych: negative for abusive relationship, depression, illegal drug usage and tobacco use    Objective:    BP 130/88   Pulse 72   Wt (!) 324 lb (147 kg)   LMP 02/12/2018   BMI 46.49 kg/m  General Appearance:    Alert, cooperative, no distress, appears stated age  Head:    Normocephalic, without obvious abnormality, atraumatic  Eyes:    PERRL, conjunctiva/corneas clear, EOM's intact, fundi     benign, both eyes  Ears:    Normal TM's and external ear canals, both ears  Nose:   Nares normal, septum midline, mucosa normal, no drainage    or sinus tenderness  Throat:   Lips, mucosa, and tongue normal; teeth and gums normal  Neck:   Supple, symmetrical, trachea midline, no adenopathy;    thyroid:  no enlargement/tenderness/nodules; no carotid   bruit or JVD  Back:     Symmetric, no curvature, ROM normal, no CVA tenderness  Lungs:     Clear to auscultation bilaterally, respirations unlabored  Chest Wall:    No tenderness or deformity   Heart:    Regular rate and rhythm, S1 and S2 normal, no murmur, rub   or gallop  Breast Exam:    No tenderness, masses, or nipple abnormality  Abdomen:     Soft, non-tender, bowel sounds active all four quadrants,    no masses, no organomegaly  Genitalia:    Normal female without lesion, discharge or tenderness  Extremities:   Extremities normal, atraumatic, no cyanosis or edema  Pulses:   2+ and symmetric all extremities  Skin:   Skin color, texture, turgor normal, no rashes or lesions  Lymph nodes:   Cervical, supraclavicular, and  axillary nodes normal  Neurologic:   CNII-XII intact, normal strength, sensation and reflexes    throughout      Lab Review Urine pregnancy test Labs reviewed yes Radiologic studies reviewed no  Assessment:    Pregnancy at [redacted]w[redacted]d weeks    Plan:     1. Encounter for supervision of normal pregnancy, antepartum, unspecified gravidity Rx: - Cervicovaginal ancillary only - Obstetric Panel, Including HIV - Culture, OB Urine - Cytology - PAP - Hemoglobinopathy evaluation - Cystic Fibrosis Mutation 97 - SMN1 COPY NUMBER ANALYSIS (SMA Carrier Screen) - Flu Vaccine QUAD 36+ mos IM - US OB Comp Less 14 Wks; Future - US OB Transvaginal; Future  Prenatal vitamins.  Counseling provided regarding continued use of seat belts, cessation of alcohol consumption, smoking or use of illicit drugs; infection precautions i.e.,  influenza/TDAP immunizations, toxoplasmosis,CMV, parvovirus, listeria and varicella; workplace safety, exercise during pregnancy; routine dental care, safe medications, sexual activity, hot tubs, saunas, pools, travel, caffeine use, fish and methlymercury, potential toxins, hair treatments, varicose veins Weight gain recommendations per IOM guidelines reviewed: underweight/BMI< 18.5--> gain 28 - 40 lbs; normal weight/BMI 18.5 - 24.9--> gain 25 - 35 lbs; overweight/BMI 25 - 29.9--> gain 15 - 25 lbs; obese/BMI >30->gain  11 - 20 lbs Problem list reviewed and updated. FIRST/CF mutation testing/NIPT/QUAD SCREEN/fragile X/[name] population testing/Spinal muscular atrophy discussed: requested. Role of ultrasound in pregnancy discussed; fetal survey: requested. Amniocentesis discussed: not indicated.  No orders of the defined types were placed in this encounter.  Orders Placed This Encounter  Procedures  . Culture, OB Urine  . US OB Comp Less 14 Wks    Standing Status:   Future    Standing Expiration Date:   07/09/2019    Order Specific Question:   Reason for Exam (SYMPTOM  OR DIAGNOSIS REQUIRED)    Answer:   Unsure LMP    Order Specific Question:   Preferred Imaging Location?    Answer:   [facility]  . US OB Transvaginal    Standing Status:   Future    Standing Expiration Date:   07/09/2019    Order Specific Question:   Reason for Exam (SYMPTOM  OR DIAGNOSIS REQUIRED)    Answer:   Dating    Order Specific Question:   Preferred Imaging Location?    Answer:   [facility]  . Flu Vaccine QUAD 36+ mos IM  . Obstetric Panel, Including HIV  . Hemoglobinopathy evaluation  . Cystic Fibrosis Mutation 97  . SMN1 COPY NUMBER ANALYSIS (SMA Carrier Screen)    Follow up in 4 weeks. 50% of 20 min visit spent on counseling and coordination of care.    Brock Bad MD 05-08-2018

## 2018-05-09 LAB — CERVICOVAGINAL ANCILLARY ONLY
Bacterial vaginitis: POSITIVE — AB
Candida vaginitis: NEGATIVE
Chlamydia: NEGATIVE
NEISSERIA GONORRHEA: NEGATIVE
Trichomonas: NEGATIVE

## 2018-05-10 ENCOUNTER — Other Ambulatory Visit: Payer: Self-pay | Admitting: Obstetrics

## 2018-05-10 DIAGNOSIS — D508 Other iron deficiency anemias: Secondary | ICD-10-CM

## 2018-05-10 DIAGNOSIS — N760 Acute vaginitis: Principal | ICD-10-CM

## 2018-05-10 DIAGNOSIS — B9689 Other specified bacterial agents as the cause of diseases classified elsewhere: Secondary | ICD-10-CM

## 2018-05-10 LAB — OBSTETRIC PANEL, INCLUDING HIV
Antibody Screen: NEGATIVE
BASOS ABS: 0 10*3/uL (ref 0.0–0.2)
Basos: 0 %
EOS (ABSOLUTE): 0.2 10*3/uL (ref 0.0–0.4)
Eos: 2 %
HIV Screen 4th Generation wRfx: NONREACTIVE
Hematocrit: 32.4 % — ABNORMAL LOW (ref 34.0–46.6)
Hemoglobin: 10.8 g/dL — ABNORMAL LOW (ref 11.1–15.9)
Hepatitis B Surface Ag: NEGATIVE
IMMATURE GRANS (ABS): 0 10*3/uL (ref 0.0–0.1)
Immature Granulocytes: 0 %
LYMPHS ABS: 2.8 10*3/uL (ref 0.7–3.1)
Lymphs: 29 %
MCH: 32.2 pg (ref 26.6–33.0)
MCHC: 33.3 g/dL (ref 31.5–35.7)
MCV: 97 fL (ref 79–97)
MONOS ABS: 0.5 10*3/uL (ref 0.1–0.9)
Monocytes: 5 %
NEUTROS ABS: 6.2 10*3/uL (ref 1.4–7.0)
Neutrophils: 64 %
Platelets: 287 10*3/uL (ref 150–450)
RBC: 3.35 x10E6/uL — ABNORMAL LOW (ref 3.77–5.28)
RDW: 13.8 % (ref 12.3–15.4)
RPR Ser Ql: NONREACTIVE
Rh Factor: POSITIVE
Rubella Antibodies, IGG: 1.21 index (ref >0.99)
WBC: 9.7 10*3/uL (ref 3.4–10.8)

## 2018-05-10 LAB — CYTOLOGY - PAP
DIAGNOSIS: NEGATIVE
HPV (WINDOPATH): NOT DETECTED

## 2018-05-10 LAB — URINE CULTURE, OB REFLEX

## 2018-05-10 LAB — HEMOGLOBINOPATHY EVALUATION
HEMOGLOBIN F QUANTITATION: 0 % (ref 0.0–2.0)
HGB C: 0 %
HGB S: 0 %
HGB VARIANT: 0 %
Hemoglobin A2 Quantitation: 2 % (ref 1.8–3.2)
Hgb A: 98 % (ref 96.4–98.8)

## 2018-05-10 LAB — CULTURE, OB URINE

## 2018-05-10 MED ORDER — FERROUS SULFATE 325 (65 FE) MG PO TABS: 325.0000 mg | ORAL_TABLET | Freq: Two times a day (BID) | ORAL | 5 refills | Status: DC

## 2018-05-10 MED ORDER — CLINDAMYCIN HCL 300 MG PO CAPS: 300.0000 mg | ORAL_CAPSULE | Freq: Three times a day (TID) | ORAL | 0 refills | Status: DC

## 2018-05-15 ENCOUNTER — Ambulatory Visit (HOSPITAL_COMMUNITY)
Admission: RE | Admit: 2018-05-15 | Discharge: 2018-05-15 | Disposition: A | Discharge status: Home / Self Care | Payer: Medicaid Other | Source: Ambulatory Visit | Attending: Obstetrics | Admitting: Obstetrics

## 2018-05-15 DIAGNOSIS — Z3491 Encounter for supervision of normal pregnancy, unspecified, first trimester: Secondary | ICD-10-CM | POA: Insufficient documentation

## 2018-05-15 DIAGNOSIS — Z3A13 13 weeks gestation of pregnancy: Secondary | ICD-10-CM | POA: Diagnosis not present

## 2018-05-15 DIAGNOSIS — Z3490 Encounter for supervision of normal pregnancy, unspecified, unspecified trimester: Secondary | ICD-10-CM

## 2018-05-17 LAB — SMN1 COPY NUMBER ANALYSIS (SMA CARRIER SCREENING)

## 2018-05-17 LAB — CYSTIC FIBROSIS MUTATION 97: Interpretation: NOT DETECTED

## 2018-06-05 ENCOUNTER — Ambulatory Visit (INDEPENDENT_AMBULATORY_CARE_PROVIDER_SITE_OTHER): Payer: Medicaid Other | Admitting: Obstetrics

## 2018-06-05 ENCOUNTER — Encounter: Payer: Self-pay | Admitting: Obstetrics

## 2018-06-05 VITALS — BP 129/91 | HR 70 | Wt 328.6 lb

## 2018-06-05 DIAGNOSIS — O0990 Supervision of high risk pregnancy, unspecified, unspecified trimester: Secondary | ICD-10-CM

## 2018-06-05 DIAGNOSIS — O09522 Supervision of elderly multigravida, second trimester: Secondary | ICD-10-CM

## 2018-06-05 DIAGNOSIS — O0992 Supervision of high risk pregnancy, unspecified, second trimester: Secondary | ICD-10-CM

## 2018-06-05 MED ORDER — PRENATE MINI 29-0.6-0.4-350 MG PO CAPS: 1.0000 | ORAL_CAPSULE | Freq: Every day | ORAL | 4 refills | Status: DC

## 2018-06-05 NOTE — Progress Notes (Signed)
CC: Soft Bowel movements 2-3 times a day.  Patient wants Genetic Screening

## 2018-06-06 ENCOUNTER — Encounter: Payer: Self-pay | Admitting: Obstetrics

## 2018-06-06 NOTE — Progress Notes (Signed)
Subjective:  Kathleen Dixon is a 37 y.o. G2P1001 at 858w2d being seen today for ongoing prenatal care.  She is currently monitored for the following issues for this low-risk pregnancy and has BV (bacterial vaginosis); Candidiasis of vulva and vagina; and Encounter for supervision of normal pregnancy, unspecified, unspecified trimester on their problem list.  Patient reports no complaints.  Contractions: Not present. Vag. Bleeding: None.  Movement: Present. Denies leaking of fluid.   The following portions of the patient's history were reviewed and updated as appropriate: allergies, current medications, past family history, past medical history, past social history, past surgical history and problem list. Problem list updated.  Objective:   Vitals:   06/05/18 0904  BP: (!) 129/91  Pulse: 70  Weight: (!) 328 lb 9.6 oz (149.1 kg)    Fetal Status:     Movement: Present     General:  Alert, oriented and cooperative. Patient is in no acute distress.  Skin: Skin is warm and dry. No rash noted.   Cardiovascular: Normal heart rate noted  Respiratory: Normal respiratory effort, no problems with respiration noted  Abdomen: Soft, gravid, appropriate for gestational age. Pain/Pressure: Absent     Pelvic:  Cervical exam deferred        Extremities: Normal range of motion.  Edema: None  Mental Status: Normal mood and affect. Normal behavior. Normal judgment and thought content.   Urinalysis:      Assessment and Plan:  Pregnancy: G2P1001 at 4058w2d  1. Supervision of high risk pregnancy, antepartum Rx: - AFP, Serum, Open Spina Bifida - Genetic Screening - Prenat w/o A-FeCbn-Meth-FA-DHA (PRENATE MINI) 29-0.6-0.4-350 MG CAPS; Take 1 capsule by mouth daily before breakfast.  Dispense: 90 capsule; Refill: 4 - US MFM OB COMP + 14 WK; Future  2. Multigravida of advanced maternal age in second trimester Rx: - US MFM OB DETAIL +14 WK; Future - AMB MFM GENETICS REFERRAL Preterm labor symptoms and  general obstetric precautions including but not limited to vaginal bleeding, contractions, leaking of fluid and fetal movement were reviewed in detail with the patient. Please refer to After Visit Summary for other counseling recommendations.  Return in about 4 weeks (around 07/03/2018) for ROB.   Brock BadHarper, Charles A, MD

## 2018-06-07 LAB — AFP, SERUM, OPEN SPINA BIFIDA
AFP MOM: 0.7
AFP Value: 16.5 ng/mL
Gest. Age on Collection Date: 16 weeks
Maternal Age At EDD: 36.8 yr
OSBR Risk 1 IN: 10000
Test Results:: NEGATIVE
Weight: 329 [lb_av]

## 2018-06-13 ENCOUNTER — Telehealth: Payer: Self-pay

## 2018-06-13 NOTE — Telephone Encounter (Signed)
Returned call and advised of results 

## 2018-06-14 NOTE — L&D Delivery Note (Signed)
Patient is a 38 y.o. now G2P2 s/p NSVD at [redacted]w[redacted]d, who was admitted for IOL for PD, diagnosed with GHTN intrapartum.  She progressed with augmentation (Cytotec, Pitocin, AROM) to complete and pushed less than one contraction to deliver.  Cord clamping delayed by several minutes then clamped by CNM and cut by FOB.  Placenta intact and spontaneous, bleeding minimal.  1st degree laceration not repaired- hemostatic.  Mom and baby stable prior to transfer to postpartum. She plans on breastfeeding and formula feeding. She requests BTL for birth control, interval.  Delivery Note At 1:08 AM a viable and healthy female was delivered via Vaginal, Spontaneous (Presentation: OA ).  APGAR: 7, 9; weight 8 lb 0.2 oz (3635 g). Placenta intact and spontaneous, bleeding minimal. 3VCord with the complication of recurrent variables. Cord pH: pending  Anesthesia:  Epidural  Episiotomy: None Lacerations: 1st degree Suture Repair: None Est. Blood Loss (mL): 796  Mom to postpartum.  Baby to Couplet care / Skin to Skin.  Lajean Manes CNM 11/27/2018, 2:00 AM

## 2018-06-21 ENCOUNTER — Encounter (HOSPITAL_COMMUNITY): Payer: Self-pay

## 2018-06-28 ENCOUNTER — Ambulatory Visit (HOSPITAL_COMMUNITY): Admission: RE | Admit: 2018-06-28 | Payer: Medicaid Other | Source: Ambulatory Visit

## 2018-06-28 ENCOUNTER — Encounter: Payer: Self-pay | Admitting: Obstetrics

## 2018-06-28 ENCOUNTER — Encounter (HOSPITAL_COMMUNITY): Payer: Medicaid Other

## 2018-06-29 ENCOUNTER — Ambulatory Visit (INDEPENDENT_AMBULATORY_CARE_PROVIDER_SITE_OTHER): Payer: Medicaid Other | Admitting: Obstetrics and Gynecology

## 2018-06-29 ENCOUNTER — Encounter: Payer: Self-pay | Admitting: Obstetrics and Gynecology

## 2018-06-29 VITALS — BP 124/86 | HR 76 | Wt 327.6 lb

## 2018-06-29 DIAGNOSIS — Z3480 Encounter for supervision of other normal pregnancy, unspecified trimester: Secondary | ICD-10-CM

## 2018-06-29 NOTE — Progress Notes (Signed)
   PRENATAL VISIT NOTE  Subjective:  Kathleen Dixon is a 38 y.o. G2P1001 at [redacted]w[redacted]d being seen today for ongoing prenatal care.  She is currently monitored for the following issues for this low-risk pregnancy and has BV (bacterial vaginosis); Candidiasis of vulva and vagina; and Encounter for supervision of normal pregnancy, unspecified, unspecified trimester on their problem list.  Patient reports no complaints.  Contractions: Not present. Vag. Bleeding: None.  Movement: Present. Denies leaking of fluid.   The following portions of the patient's history were reviewed and updated as appropriate: allergies, current medications, past family history, past medical history, past social history, past surgical history and problem list. Problem list updated.  Objective:   Vitals:   06/29/18 0923  BP: 124/86  Pulse: 76  Weight: (!) 327 lb 9.6 oz (148.6 kg)    Fetal Status: Fetal Heart Rate (bpm): 148   Movement: Present     General:  Alert, oriented and cooperative. Patient is in no acute distress.  Skin: Skin is warm and dry. No rash noted.   Cardiovascular: Normal heart rate noted  Respiratory: Normal respiratory effort, no problems with respiration noted  Abdomen: Soft, gravid, appropriate for gestational age.  Pain/Pressure: Absent     Pelvic: Cervical exam deferred        Extremities: Normal range of motion.  Edema: None  Mental Status: Normal mood and affect. Normal behavior. Normal judgment and thought content.   Assessment and Plan:  Pregnancy: G2P1001 at [redacted]w[redacted]d  1. Supervision of other normal pregnancy, antepartum Patient is doing well without complaints Anatomy ultrasound scheduled  Preterm labor symptoms and general obstetric precautions including but not limited to vaginal bleeding, contractions, leaking of fluid and fetal movement were reviewed in detail with the patient. Please refer to After Visit Summary for other counseling recommendations.  No follow-ups on  file.  Future Appointments  Date Time Provider Department Center  06/29/2018  9:30 AM Vernecia Umble, Gigi Gin, MD [facility] None  07/03/2018  8:00 AM [facility] [place] 3 [facility]  07/03/2018  9:00 AM [facility] GENETIC COUNSELING [facility]    Catalina Antigua, MD

## 2018-06-29 NOTE — Progress Notes (Signed)
Pt is here for ROB. [redacted]w[redacted]d.

## 2018-07-03 ENCOUNTER — Ambulatory Visit (HOSPITAL_COMMUNITY)
Admission: RE | Admit: 2018-07-03 | Discharge: 2018-07-03 | Disposition: A | Discharge status: Home / Self Care | Payer: Medicaid Other | Source: Ambulatory Visit | Attending: Obstetrics | Admitting: Obstetrics

## 2018-07-03 ENCOUNTER — Other Ambulatory Visit (HOSPITAL_COMMUNITY): Payer: Self-pay | Admitting: *Deleted

## 2018-07-03 ENCOUNTER — Ambulatory Visit (HOSPITAL_COMMUNITY): Payer: Self-pay | Admitting: Obstetrics

## 2018-07-03 ENCOUNTER — Encounter (HOSPITAL_COMMUNITY): Payer: Self-pay

## 2018-07-03 DIAGNOSIS — O99212 Obesity complicating pregnancy, second trimester: Secondary | ICD-10-CM | POA: Diagnosis not present

## 2018-07-03 DIAGNOSIS — Z362 Encounter for other antenatal screening follow-up: Secondary | ICD-10-CM

## 2018-07-03 DIAGNOSIS — Z3A20 20 weeks gestation of pregnancy: Secondary | ICD-10-CM

## 2018-07-03 DIAGNOSIS — O09522 Supervision of elderly multigravida, second trimester: Secondary | ICD-10-CM | POA: Insufficient documentation

## 2018-07-03 DIAGNOSIS — Z363 Encounter for antenatal screening for malformations: Secondary | ICD-10-CM | POA: Diagnosis not present

## 2018-07-03 DIAGNOSIS — O09529 Supervision of elderly multigravida, unspecified trimester: Secondary | ICD-10-CM

## 2018-07-03 NOTE — ED Notes (Signed)
Declines further genetic screening today.

## 2018-07-03 NOTE — ED Notes (Signed)
Patient and family in session with Genetic Counselor, Moody Bruins.

## 2018-07-06 ENCOUNTER — Encounter: Payer: Self-pay | Admitting: *Deleted

## 2018-07-27 ENCOUNTER — Encounter: Payer: Medicaid Other | Admitting: Obstetrics and Gynecology

## 2018-07-31 ENCOUNTER — Encounter: Payer: Self-pay | Admitting: Obstetrics

## 2018-07-31 ENCOUNTER — Other Ambulatory Visit (HOSPITAL_COMMUNITY): Payer: Self-pay | Admitting: *Deleted

## 2018-07-31 ENCOUNTER — Telehealth: Payer: Self-pay

## 2018-07-31 ENCOUNTER — Encounter (HOSPITAL_COMMUNITY): Payer: Self-pay

## 2018-07-31 ENCOUNTER — Ambulatory Visit (INDEPENDENT_AMBULATORY_CARE_PROVIDER_SITE_OTHER): Payer: Medicaid Other | Admitting: Obstetrics

## 2018-07-31 ENCOUNTER — Ambulatory Visit (HOSPITAL_COMMUNITY)
Admission: RE | Admit: 2018-07-31 | Discharge: 2018-07-31 | Disposition: A | Discharge status: Home / Self Care | Payer: Medicaid Other | Source: Ambulatory Visit | Attending: Obstetrics | Admitting: Obstetrics

## 2018-07-31 VITALS — BP 135/82 | HR 65 | Wt 335.4 lb

## 2018-07-31 DIAGNOSIS — O0990 Supervision of high risk pregnancy, unspecified, unspecified trimester: Secondary | ICD-10-CM

## 2018-07-31 DIAGNOSIS — O09529 Supervision of elderly multigravida, unspecified trimester: Secondary | ICD-10-CM

## 2018-07-31 DIAGNOSIS — Z362 Encounter for other antenatal screening follow-up: Secondary | ICD-10-CM | POA: Diagnosis not present

## 2018-07-31 DIAGNOSIS — Z3A24 24 weeks gestation of pregnancy: Secondary | ICD-10-CM | POA: Diagnosis not present

## 2018-07-31 DIAGNOSIS — O99212 Obesity complicating pregnancy, second trimester: Secondary | ICD-10-CM | POA: Diagnosis not present

## 2018-07-31 DIAGNOSIS — O0992 Supervision of high risk pregnancy, unspecified, second trimester: Secondary | ICD-10-CM

## 2018-07-31 DIAGNOSIS — O09522 Supervision of elderly multigravida, second trimester: Secondary | ICD-10-CM

## 2018-07-31 NOTE — Telephone Encounter (Signed)
Dr. Clearance Coots informed of elevated BP readings today at MFM appt. Pt here now for ROB.

## 2018-07-31 NOTE — Progress Notes (Signed)
Subjective:  Kathleen Dixon is a 38 y.o. G2P1001 at [redacted]w[redacted]d being seen today for ongoing prenatal care.  She is currently monitored for the following issues for this high-risk pregnancy and has BV (bacterial vaginosis); Candidiasis of vulva and vagina; and Encounter for supervision of normal pregnancy, unspecified, unspecified trimester on their problem list.  Patient reports no complaints.  Contractions: Not present. Vag. Bleeding: None.  Movement: Present. Denies leaking of fluid.   The following portions of the patient's history were reviewed and updated as appropriate: allergies, current medications, past family history, past medical history, past social history, past surgical history and problem list. Problem list updated.  Objective:   Vitals:   07/31/18 0925  BP: 135/82  Pulse: 65  Weight: (!) 335 lb 6.4 oz (152.1 kg)    Fetal Status:     Movement: Present     General:  Alert, oriented and cooperative. Patient is in no acute distress.  Skin: Skin is warm and dry. No rash noted.   Cardiovascular: Normal heart rate noted  Respiratory: Normal respiratory effort, no problems with respiration noted  Abdomen: Soft, gravid, appropriate for gestational age. Pain/Pressure: Absent     Pelvic:  Cervical exam deferred        Extremities: Normal range of motion.  Edema: Trace  Mental Status: Normal mood and affect. Normal behavior. Normal judgment and thought content.   Urinalysis:      Assessment and Plan:  Pregnancy: G2P1001 at [redacted]w[redacted]d  1. Supervision of high risk pregnancy, antepartum  2. Multigravida of advanced maternal age in second trimester   Preterm labor symptoms and general obstetric precautions including but not limited to vaginal bleeding, contractions, leaking of fluid and fetal movement were reviewed in detail with the patient. Please refer to After Visit Summary for other counseling recommendations.  Return in about 4 weeks (around 08/28/2018) for ROB, 2 hour  OGTT.   Brock Bad, MD

## 2018-08-17 ENCOUNTER — Encounter (HOSPITAL_COMMUNITY): Payer: Self-pay

## 2018-08-17 ENCOUNTER — Inpatient Hospital Stay (HOSPITAL_COMMUNITY)
Admission: AD | Admit: 2018-08-17 | Discharge: 2018-08-17 | Disposition: A | Discharge status: Home / Self Care | Payer: Medicaid Other | Attending: Obstetrics and Gynecology | Admitting: Obstetrics and Gynecology

## 2018-08-17 ENCOUNTER — Inpatient Hospital Stay (HOSPITAL_BASED_OUTPATIENT_CLINIC_OR_DEPARTMENT_OTHER): Payer: Medicaid Other

## 2018-08-17 ENCOUNTER — Other Ambulatory Visit: Payer: Self-pay

## 2018-08-17 DIAGNOSIS — O26892 Other specified pregnancy related conditions, second trimester: Secondary | ICD-10-CM

## 2018-08-17 DIAGNOSIS — O99212 Obesity complicating pregnancy, second trimester: Secondary | ICD-10-CM

## 2018-08-17 DIAGNOSIS — R109 Unspecified abdominal pain: Secondary | ICD-10-CM | POA: Diagnosis present

## 2018-08-17 DIAGNOSIS — F17200 Nicotine dependence, unspecified, uncomplicated: Secondary | ICD-10-CM | POA: Diagnosis not present

## 2018-08-17 DIAGNOSIS — O99332 Smoking (tobacco) complicating pregnancy, second trimester: Secondary | ICD-10-CM | POA: Insufficient documentation

## 2018-08-17 DIAGNOSIS — O9989 Other specified diseases and conditions complicating pregnancy, childbirth and the puerperium: Secondary | ICD-10-CM | POA: Diagnosis not present

## 2018-08-17 DIAGNOSIS — O09522 Supervision of elderly multigravida, second trimester: Secondary | ICD-10-CM | POA: Diagnosis not present

## 2018-08-17 DIAGNOSIS — O368190 Decreased fetal movements, unspecified trimester, not applicable or unspecified: Secondary | ICD-10-CM | POA: Diagnosis not present

## 2018-08-17 DIAGNOSIS — Z3A26 26 weeks gestation of pregnancy: Secondary | ICD-10-CM | POA: Diagnosis not present

## 2018-08-17 DIAGNOSIS — Z3480 Encounter for supervision of other normal pregnancy, unspecified trimester: Secondary | ICD-10-CM

## 2018-08-17 LAB — URINALYSIS, ROUTINE W REFLEX MICROSCOPIC
Bilirubin Urine: NEGATIVE
Glucose, UA: NEGATIVE mg/dL
Hgb urine dipstick: NEGATIVE
KETONES UR: NEGATIVE mg/dL
Leukocytes,Ua: NEGATIVE
Nitrite: NEGATIVE
Protein, ur: NEGATIVE mg/dL
Specific Gravity, Urine: 1.018 (ref 1.005–1.030)
pH: 7 (ref 5.0–8.0)

## 2018-08-17 LAB — WET PREP, GENITAL
CLUE CELLS WET PREP: NONE SEEN
Sperm: NONE SEEN
TRICH WET PREP: NONE SEEN
Yeast Wet Prep HPF POC: NONE SEEN

## 2018-08-17 LAB — PROTEIN / CREATININE RATIO, URINE
Creatinine, Urine: 175.32 mg/dL
Protein Creatinine Ratio: 0.11 mg/mg{Cre} (ref 0.00–0.15)
Total Protein, Urine: 20 mg/dL

## 2018-08-17 NOTE — MAU Note (Signed)
Pt presents with cramping beginning this morning around 0430. She states she feels a "knot" in the skin of her abdomen that is causing pain.   Patient states less fetal movement since last night, with some hours she does not feel baby moving.

## 2018-08-17 NOTE — MAU Note (Signed)
Pain in the bottom of her stomach, cramping, feels like there are knots in it.  Started yesterday.  Gets hot and cold fast.  Denies urinary symptoms. No diarrhea or constipation, has been nauseated and vomited this morning.

## 2018-08-17 NOTE — Discharge Instructions (Signed)

## 2018-08-17 NOTE — MAU Provider Note (Addendum)
History     CSN: [id number]  Arrival date and time: 08/17/18 1304   First Provider Initiated Contact with Patient 08/17/18 1427      Chief Complaint  Patient presents with  . Abdominal Pain  . Nausea   Kathleen Dixon is a 38 y.o. G2P1001 at [redacted]w[redacted]d who presents for Abdominal Pain.  She states she started having cramping this morning that is intermittent in nature, but occurs every hour 1-2x.  She states she started having pain and "a line that feels like a vein" in the lower area of her stomach.  She states that it is painful that is intensified with touch.  She is unable to articulate what it feels like, but knows that it "is a weird feeling."  Patient endorses fetal movement and denies vaginal discharge, bleeding, leaking of fluid.  She reports sexual activity yesterday, but reports her symptoms started prior to that.      OB History    Gravida  2   Para  1   Term  1   Preterm  0   AB  0   Living  1     SAB  0   TAB  0   Ectopic  0   Multiple  0   Live Births  1           Past Medical History:  Diagnosis Date  . Diabetes (HCC) 2004    Past Surgical History:  Procedure Laterality Date  . ANKLE SURGERY    . HIP SURGERY    . LEG SURGERY      Family History  Problem Relation Age of Onset  . Diabetes Mother   . Hypertension Mother   . Diabetes Father   . Hypertension Father     Social History   Tobacco Use  . Smoking status: Current Some Day Smoker  . Smokeless tobacco: Never Used  . Tobacco comment: not since confirmed pregnancy  Substance Use Topics  . Alcohol use: Not Currently    Comment: not since confirmed pregnancy  . Drug use: No    Allergies: No Known Allergies  Medications Prior to Admission  Medication Sig Dispense Refill Last Dose  . ferrous sulfate 325 (65 FE) MG tablet Take 1 tablet (325 mg total) by mouth 2 (two) times daily with a meal. 60 tablet 5 Taking  . Prenat w/o A-FeCbn-Meth-FA-DHA (PRENATE MINI) 29-0.6-0.4-350  MG CAPS Take 1 capsule by mouth daily before breakfast. 90 capsule 4 Taking    Review of Systems  Constitutional: Negative for chills and fever.  Gastrointestinal: Positive for abdominal pain. Negative for constipation, diarrhea, nausea and vomiting.  Genitourinary: Negative for dyspareunia, dysuria, vaginal bleeding and vaginal discharge.  Neurological: Negative for dizziness, light-headedness and headaches.   Physical Exam   Blood pressure (!) 139/93, pulse 70, temperature 98.7 F (37.1 C), temperature source Oral, resp. rate 18, weight (!) 152.3 kg, last menstrual period 02/12/2018, SpO2 100 %.  Vitals:   08/17/18 1330 08/17/18 1400 08/17/18 1500  BP: (!) 139/93 (!) 132/105 131/79  Pulse: 70 84 73  Resp: 18    Temp: 98.7 F (37.1 C)    TempSrc: Oral    SpO2: 100%    Weight: (!) 152.3 kg       Physical Exam  Constitutional: She is oriented to person, place, and time. She appears well-developed and well-nourished.  HENT:  Head: Normocephalic and atraumatic.  Eyes: Conjunctivae are normal.  Neck: Normal range of motion.  Cardiovascular: Normal rate,  regular rhythm and normal heart sounds.  Respiratory: Effort normal and breath sounds normal.  GI: Soft. Bowel sounds are normal. She exhibits no mass. There is abdominal tenderness (1x1cm as noted.  No bruising or tenderness around the area. ). There is no rebound and no guarding.    Large Panus  Musculoskeletal: Normal range of motion.  Neurological: She is oriented to person, place, and time.  Skin: Skin is warm and dry.  Psychiatric: She has a normal mood and affect. Her behavior is normal.    Fetal Assessment 145 bpm, Mod Var, -Decels, +Accels Toco: None graphed  MAU Course   Results for orders placed or performed during the hospital encounter of 08/17/18 (from the past 24 hour(s))  Urinalysis, Routine w reflex microscopic     Status: None   Collection Time: 08/17/18  1:39 PM  Result Value Ref Range   Color,  Urine YELLOW YELLOW   APPearance CLEAR CLEAR   Specific Gravity, Urine 1.018 1.005 - 1.030   pH 7.0 5.0 - 8.0   Glucose, UA NEGATIVE NEGATIVE mg/dL   Hgb urine dipstick NEGATIVE NEGATIVE   Bilirubin Urine NEGATIVE NEGATIVE   Ketones, ur NEGATIVE NEGATIVE mg/dL   Protein, ur NEGATIVE NEGATIVE mg/dL   Nitrite NEGATIVE NEGATIVE   Leukocytes,Ua NEGATIVE NEGATIVE  Wet prep, genital     Status: Abnormal   Collection Time: 08/17/18  2:50 PM  Result Value Ref Range   Yeast Wet Prep HPF POC NONE SEEN NONE SEEN   Trich, Wet Prep NONE SEEN NONE SEEN   Clue Cells Wet Prep HPF POC NONE SEEN NONE SEEN   WBC, Wet Prep HPF POC FEW (A) NONE SEEN   Sperm NONE SEEN   Protein / creatinine ratio, urine     Status: None   Collection Time: 08/17/18  4:52 PM  Result Value Ref Range   Creatinine, Urine 175.32 mg/dL   Total Protein, Urine 20 mg/dL   Protein Creatinine Ratio 0.11 0.00 - 0.15 mg/mg[Cre]   [place] Mfm Ob Limited  Result Date: 08/17/2018 ----------------------------------------------------------------------  OBSTETRICS REPORT                       (Signed Final 08/17/2018 05:45 pm) ---------------------------------------------------------------------- Patient Info  ID #:       [id number]                          D.O.B.:  1980/08/02 (37 yrs)  Name:       Kathleen Dixon               Visit Date: 08/17/2018 04:17 pm ---------------------------------------------------------------------- Performed By  Performed By:     Marcellina Millin          Ref. Address:      [address]                    [address]                                                              [address]  [place], [place]                                                              [address]  Attending:        Noralee Space MD        [facility]  Referred By:      Brock Bad MD ---------------------------------------------------------------------- Orders   #  Description                           Code        Ordered By   1  [place] MFM OB LIMITED                     [phone]    Aamya Orellana  ----------------------------------------------------------------------   #  Order #                     Accession #                Episode #   1  [id number]                   [id number]                 [id number]  ---------------------------------------------------------------------- Indications   Abdominal pain in pregnancy                     O99.89   [redacted] weeks gestation of pregnancy                 Z3A.26   Decreased fetal movement                        O36.8190   Advanced maternal age multigravida 63+,         O51.522   second trimester   Obesity complicating pregnancy, second          O99.212   trimester  ---------------------------------------------------------------------- Vital Signs                                                 Height:        5'10" ---------------------------------------------------------------------- Fetal Evaluation  Num Of Fetuses:          1  Fetal Heart              152  Rate(bpm):  Cardiac Activity:        Observed  Presentation:            Transverse, head  to maternal left  Placenta:                Anterior  P. Cord Insertion:       Visualized  Amniotic Fluid  AFI FV:      Within normal limits                              Largest Pocket(cm)                              7.3  Comment:    Good fetal movement observed throughout study. ---------------------------------------------------------------------- OB History  Gravidity:    2         Term:   1  Living:       1 ---------------------------------------------------------------------- Gestational Age  LMP:           26w 4d        Date:  02/12/18                 EDD:    11/19/18  Best:          26w 4d     Det. By:  LMP  (02/12/18)          EDD:    11/19/18  ---------------------------------------------------------------------- Anatomy  Stomach:               Appears normal,        Bladder:                Appears normal                         left sided ---------------------------------------------------------------------- Cervix Uterus Adnexa  Cervix  Not visualized (advanced GA >24wks) ---------------------------------------------------------------------- Impression  Patient was evaluated for abdominal pain.  A limited ultrasound study was performed. Amniotic fluid is  normal and good fetal activity is seen. ----------------------------------------------------------------------                  Noralee Space, MD Electronically Signed Final Report   08/17/2018 05:45 pm ----------------------------------------------------------------------   MDM PE Labs: Wet Prep, UA, PC Ratio  Limited US  Assessment and Plan  38 year old G2P1001 at 26.4 weeks Abdominal Pain  Cramping  -Exam findings discussed -Informed that findings are inconclusive on exam.  Explained to patient that area of tenderness is very specific and may be related to trauma to the area although exam is negative for bruising.   -Encouraged to monitor area for worsening of or onset of new symptoms including bruising and tenderness. -Continue to try to obtain adequate EFM -Wet Prep pending  Follow Up (3:33 PM)  -Unable to obtain adequate fetal tracing -Remove EFM -Will send for BPP and reevaluate at home -Wet prep returns with insignificant findings -Results discussed with patient   Follow Up (3:43 PM)  -Dr. Judeth Cornfield calls and recommends a limited US with review of FHT and fluid levels for fetal well-being as BPP at current GA can yield inadequate results.  -Orders changed to reflect MD recommendation -Will send to [place] as appropriate.   Follow Up (4:52 PM)  -[place] results return without significant findings -BP remain elevated -PIH labs recommended and patient refuses stating she  is ready to go and eat. -Provider offered food and drink, patient refuses. -PC Ratio added to initial urine specimen. -If abnormal patient can have additional testing  in office. -Precautions given. -Encouraged to call or return to MAU if symptoms worsen or with the onset of new symptoms. -Discharged to home in stable condition.  Cherre Robins MSN, CNM 08/17/2018, 2:27 PM

## 2018-08-28 ENCOUNTER — Ambulatory Visit (HOSPITAL_COMMUNITY): Payer: Medicaid Other

## 2018-08-28 ENCOUNTER — Other Ambulatory Visit: Payer: Self-pay

## 2018-08-28 ENCOUNTER — Other Ambulatory Visit: Payer: Medicaid Other

## 2018-08-28 ENCOUNTER — Encounter: Payer: Medicaid Other | Admitting: Obstetrics

## 2018-09-01 ENCOUNTER — Inpatient Hospital Stay (HOSPITAL_COMMUNITY): Admission: RE | Admit: 2018-09-01 | Payer: Medicaid Other | Source: Ambulatory Visit

## 2018-09-01 ENCOUNTER — Encounter (HOSPITAL_COMMUNITY): Payer: Self-pay

## 2018-09-01 ENCOUNTER — Ambulatory Visit (HOSPITAL_COMMUNITY): Payer: Medicaid Other

## 2018-09-08 ENCOUNTER — Other Ambulatory Visit: Payer: Medicaid Other

## 2018-09-08 ENCOUNTER — Encounter: Payer: Medicaid Other | Admitting: Obstetrics

## 2018-09-25 ENCOUNTER — Other Ambulatory Visit: Payer: Medicaid Other

## 2018-09-25 ENCOUNTER — Encounter: Payer: Medicaid Other | Admitting: Obstetrics

## 2018-09-27 ENCOUNTER — Encounter: Payer: Self-pay | Admitting: Obstetrics

## 2018-09-27 ENCOUNTER — Ambulatory Visit (INDEPENDENT_AMBULATORY_CARE_PROVIDER_SITE_OTHER): Payer: Medicaid Other | Admitting: Obstetrics

## 2018-09-27 ENCOUNTER — Other Ambulatory Visit: Payer: Medicaid Other

## 2018-09-27 ENCOUNTER — Other Ambulatory Visit: Payer: Self-pay

## 2018-09-27 VITALS — BP 128/83 | HR 87 | Wt 335.9 lb

## 2018-09-27 DIAGNOSIS — O09523 Supervision of elderly multigravida, third trimester: Secondary | ICD-10-CM

## 2018-09-27 DIAGNOSIS — Z3689 Encounter for other specified antenatal screening: Secondary | ICD-10-CM

## 2018-09-27 DIAGNOSIS — O0990 Supervision of high risk pregnancy, unspecified, unspecified trimester: Secondary | ICD-10-CM

## 2018-09-27 DIAGNOSIS — O99213 Obesity complicating pregnancy, third trimester: Secondary | ICD-10-CM

## 2018-09-27 DIAGNOSIS — O09529 Supervision of elderly multigravida, unspecified trimester: Secondary | ICD-10-CM

## 2018-09-27 DIAGNOSIS — O99210 Obesity complicating pregnancy, unspecified trimester: Secondary | ICD-10-CM

## 2018-09-27 DIAGNOSIS — Z3A32 32 weeks gestation of pregnancy: Secondary | ICD-10-CM

## 2018-09-27 MED ORDER — COMFORT FIT MATERNITY SUPP SM MISC: 0 refills | Status: DC

## 2018-09-27 NOTE — Progress Notes (Signed)
Pt is here for ROB and 2hrGTT. [redacted]w[redacted]d.

## 2018-09-27 NOTE — Progress Notes (Signed)
Subjective:  Kathleen Dixon is a 38 y.o. G2P1001 at [redacted]w[redacted]d being seen today for ongoing prenatal care.  She is currently monitored for the following issues for this high-risk pregnancy and has BV (bacterial vaginosis); Candidiasis of vulva and vagina; and Encounter for supervision of normal pregnancy, unspecified, unspecified trimester on their problem list.  Patient reports pelvic pressure.  Contractions: Not present. Vag. Bleeding: None.  Movement: Present. Denies leaking of fluid.   The following portions of the patient's history were reviewed and updated as appropriate: allergies, current medications, past family history, past medical history, past social history, past surgical history and problem list. Problem list updated.  Objective:   Vitals:   09/27/18 0809  BP: 128/83  Pulse: 87  Weight: (!) 335 lb 14.4 oz (152.4 kg)    Fetal Status:     Movement: Present     General:  Alert, oriented and cooperative. Patient is in no acute distress.  Skin: Skin is warm and dry. No rash noted.   Cardiovascular: Normal heart rate noted  Respiratory: Normal respiratory effort, no problems with respiration noted  Abdomen: Soft, gravid, appropriate for gestational age. Pain/Pressure: Absent     Pelvic:  Cervical exam deferred        Extremities: Normal range of motion.  Edema: Trace  Mental Status: Normal mood and affect. Normal behavior. Normal judgment and thought content.   Urinalysis:      Assessment and Plan:  Pregnancy: G2P1001 at [redacted]w[redacted]d  1. Antepartum multigravida of advanced maternal age Rx: - Glucose Tolerance, 2 Hours w/1 Hour - CBC - RPR - HIV Antibody (routine testing w rflx) - Elastic Bandages & Supports (COMFORT FIT MATERNITY SUPP SM) MISC; Wear as directed.  Dispense: 1 each; Refill: 0  2. Supervision of high risk pregnancy, antepartum  3. Obesity affecting pregnancy, antepartum  4. Encounter for ultrasound to assess interval growth of fetus Rx: - [place] MFM OB FOLLOW  UP; Future  Preterm labor symptoms and general obstetric precautions including but not limited to vaginal bleeding, contractions, leaking of fluid and fetal movement were reviewed in detail with the patient. Please refer to After Visit Summary for other counseling recommendations.  Return in about 2 weeks (around 10/11/2018) for Televisit.   Brock Bad, MD

## 2018-09-28 ENCOUNTER — Other Ambulatory Visit: Payer: Self-pay | Admitting: Obstetrics

## 2018-09-28 LAB — CBC
Hematocrit: 29.7 % — ABNORMAL LOW (ref 34.0–46.6)
Hemoglobin: 9.8 g/dL — ABNORMAL LOW (ref 11.1–15.9)
MCH: 31.8 pg (ref 26.6–33.0)
MCHC: 33 g/dL (ref 31.5–35.7)
MCV: 96 fL (ref 79–97)
Platelets: 282 10*3/uL (ref 150–450)
RBC: 3.08 x10E6/uL — ABNORMAL LOW (ref 3.77–5.28)
RDW: 13.1 % (ref 11.7–15.4)
WBC: 10.2 10*3/uL (ref 3.4–10.8)

## 2018-09-28 LAB — GLUCOSE TOLERANCE, 2 HOURS W/ 1HR
Glucose, 1 hour: 145 mg/dL (ref 65–179)
Glucose, 2 hour: 115 mg/dL (ref 65–152)
Glucose, Fasting: 77 mg/dL (ref 65–91)

## 2018-09-28 LAB — RPR: RPR Ser Ql: NONREACTIVE

## 2018-09-28 LAB — HIV ANTIBODY (ROUTINE TESTING W REFLEX): HIV Screen 4th Generation wRfx: NONREACTIVE

## 2018-10-04 ENCOUNTER — Other Ambulatory Visit: Payer: Self-pay

## 2018-10-04 ENCOUNTER — Ambulatory Visit (HOSPITAL_COMMUNITY)
Admission: RE | Admit: 2018-10-04 | Discharge: 2018-10-04 | Disposition: A | Discharge status: Home / Self Care | Payer: Medicaid Other | Source: Ambulatory Visit | Attending: Obstetrics and Gynecology | Admitting: Obstetrics and Gynecology

## 2018-10-04 ENCOUNTER — Ambulatory Visit (HOSPITAL_COMMUNITY): Payer: Medicaid Other | Admitting: *Deleted

## 2018-10-04 ENCOUNTER — Encounter (HOSPITAL_COMMUNITY): Payer: Self-pay

## 2018-10-04 VITALS — BP 128/80 | HR 73 | Temp 98.5°F

## 2018-10-04 DIAGNOSIS — O09529 Supervision of elderly multigravida, unspecified trimester: Secondary | ICD-10-CM | POA: Insufficient documentation

## 2018-10-04 DIAGNOSIS — O99213 Obesity complicating pregnancy, third trimester: Secondary | ICD-10-CM | POA: Diagnosis not present

## 2018-10-04 DIAGNOSIS — Z362 Encounter for other antenatal screening follow-up: Secondary | ICD-10-CM

## 2018-10-04 DIAGNOSIS — Z3A33 33 weeks gestation of pregnancy: Secondary | ICD-10-CM | POA: Diagnosis not present

## 2018-10-05 ENCOUNTER — Other Ambulatory Visit (HOSPITAL_COMMUNITY): Payer: Self-pay | Admitting: *Deleted

## 2018-10-05 DIAGNOSIS — O09523 Supervision of elderly multigravida, third trimester: Secondary | ICD-10-CM

## 2018-10-12 ENCOUNTER — Telehealth: Payer: Self-pay

## 2018-10-12 NOTE — Telephone Encounter (Signed)
Left message to verify if patient has BP cuff and enroll in babyRx if needed.  Rolm Bookbinder, CNM 10/12/18 3:45 PM

## 2018-10-13 ENCOUNTER — Encounter: Payer: Medicaid Other | Admitting: Obstetrics

## 2018-10-13 ENCOUNTER — Other Ambulatory Visit: Payer: Self-pay

## 2018-10-13 ENCOUNTER — Encounter: Payer: Self-pay | Admitting: Medical

## 2018-10-13 ENCOUNTER — Encounter: Payer: Medicaid Other | Admitting: Medical

## 2018-10-13 ENCOUNTER — Ambulatory Visit (INDEPENDENT_AMBULATORY_CARE_PROVIDER_SITE_OTHER): Payer: Medicaid Other | Admitting: Medical

## 2018-10-13 VITALS — BP 130/76

## 2018-10-13 DIAGNOSIS — Z3480 Encounter for supervision of other normal pregnancy, unspecified trimester: Secondary | ICD-10-CM

## 2018-10-13 DIAGNOSIS — O0990 Supervision of high risk pregnancy, unspecified, unspecified trimester: Secondary | ICD-10-CM

## 2018-10-13 DIAGNOSIS — O0993 Supervision of high risk pregnancy, unspecified, third trimester: Secondary | ICD-10-CM

## 2018-10-13 DIAGNOSIS — Z3A34 34 weeks gestation of pregnancy: Secondary | ICD-10-CM

## 2018-10-13 NOTE — Progress Notes (Signed)
I connected with@ on 10/13/18 at  8:15 AM EDT by: WebEx and verified that I am speaking with the correct person using two identifiers.  Patient is located at home and provider is located at [facility].     The purpose of this virtual visit is to provide medical care while limiting exposure to the novel coronavirus. I discussed the limitations, risks, security and privacy concerns of performing an evaluation and management service by WebEx and the availability of in person appointments. I also discussed with the patient that there may be a patient responsible charge related to this service. By engaging in this virtual visit, you consent to the provision of healthcare.  Additionally, you authorize for your insurance to be billed for the services provided during this visit.  The patient expressed understanding and agreed to proceed.  The following staff members participated in the virtual visit:  Frutoso Chase    PRENATAL VISIT NOTE  Subjective:  Kathleen Dixon is a 38 y.o. G2P1001 at [redacted]w[redacted]d  for phone visit for ongoing prenatal care.  She is currently monitored for the following issues for this low-risk pregnancy and has BV (bacterial vaginosis); Candidiasis of vulva and vagina; and Encounter for supervision of normal pregnancy, unspecified, unspecified trimester on their problem list.  Patient reports no complaints.  Contractions: Not present. Vag. Bleeding: None.  Movement: Present. Denies leaking of fluid.   The following portions of the patient's history were reviewed and updated as appropriate: allergies, current medications, past family history, past medical history, past social history, past surgical history and problem list.   Objective:   Vitals:   10/13/18 0812  BP: 130/76   Self-Obtained  Fetal Status:     Movement: Present     Assessment and Plan:  Pregnancy: G2P1001 at [redacted]w[redacted]d 1. Supervision of high risk pregnancy, antepartum - Babyscripts Schedule Optimization - Has BP Cuff -  Normal GTT results reviewed - Has growth [place] scheduled for 5/19  Preterm labor symptoms and general obstetric precautions including but not limited to vaginal bleeding, contractions, leaking of fluid and fetal movement were reviewed in detail with the patient.  Return in about 2 weeks (around 10/27/2018) for In-Person, LOB.  Future Appointments  Date Time Provider Department Center  10/31/2018  8:15 AM [facility] [place] 4 [facility]     Time spent on virtual visit: 12 minutes  Vonzella Nipple, PA-C

## 2018-10-13 NOTE — Patient Instructions (Signed)
Fetal Movement Counts Patient Name: ________________________________________________ Patient Due Date: ____________________ What is a fetal movement count?  A fetal movement count is the number of times that you feel your baby move during a certain amount of time. This may also be called a fetal kick count. A fetal movement count is recommended for every pregnant woman. You may be asked to start counting fetal movements as early as week 28 of your pregnancy. Pay attention to when your baby is most active. You may notice your baby's sleep and wake cycles. You may also notice things that make your baby move more. You should do a fetal movement count:  When your baby is normally most active.  At the same time each day. A good time to count movements is while you are resting, after having something to eat and drink. How do I count fetal movements? 1. Find a quiet, comfortable area. Sit, or lie down on your side. 2. Write down the date, the start time and stop time, and the number of movements that you felt between those two times. Take this information with you to your health care visits. 3. For 2 hours, count kicks, flutters, swishes, rolls, and jabs. You should feel at least 10 movements during 2 hours. 4. You may stop counting after you have felt 10 movements. 5. If you do not feel 10 movements in 2 hours, have something to eat and drink. Then, keep resting and counting for 1 hour. If you feel at least 4 movements during that hour, you may stop counting. Contact a health care provider if:  You feel fewer than 4 movements in 2 hours.  Your baby is not moving like he or she usually does. Date: ____________ Start time: ____________ Stop time: ____________ Movements: ____________ Date: ____________ Start time: ____________ Stop time: ____________ Movements: ____________ Date: ____________ Start time: ____________ Stop time: ____________ Movements: ____________ Date: ____________ Start time:  ____________ Stop time: ____________ Movements: ____________ Date: ____________ Start time: ____________ Stop time: ____________ Movements: ____________ Date: ____________ Start time: ____________ Stop time: ____________ Movements: ____________ Date: ____________ Start time: ____________ Stop time: ____________ Movements: ____________ Date: ____________ Start time: ____________ Stop time: ____________ Movements: ____________ Date: ____________ Start time: ____________ Stop time: ____________ Movements: ____________ This information is not intended to replace advice given to you by your health care provider. Make sure you discuss any questions you have with your health care provider. Document Released: 06/30/2006 Document Revised: 01/28/2016 Document Reviewed: 07/10/2015 Else[employer] Interactive Patient Education  2019 [employer] Braxton Hicks Contractions Contractions of the uterus can occur throughout pregnancy, but they are not always a sign that you are in labor. You may have practice contractions called Braxton Hicks contractions. These false labor contractions are sometimes confused with true labor. What are Braxton Hicks contractions? Braxton Hicks contractions are tightening movements that occur in the muscles of the uterus before labor. Unlike true labor contractions, these contractions do not result in opening (dilation) and thinning of the cervix. Toward the end of pregnancy (32-34 weeks), Braxton Hicks contractions can happen more often and may become stronger. These contractions are sometimes difficult to tell apart from true labor because they can be very uncomfortable. You should not feel embarrassed if you go to the hospital with false labor. Sometimes, the only way to tell if you are in true labor is for your health care provider to look for changes in the cervix. The health care provider will do a physical exam and may monitor your contractions. If   you are not in true labor, the exam  should show that your cervix is not dilating and your water has not broken. If there are no other health problems associated with your pregnancy, it is completely safe for you to be sent home with false labor. You may continue to have Braxton Hicks contractions until you go into true labor. How to tell the difference between true labor and false labor True labor  Contractions last 30-70 seconds.  Contractions become very regular.  Discomfort is usually felt in the top of the uterus, and it spreads to the lower abdomen and low back.  Contractions do not go away with walking.  Contractions usually become more intense and increase in frequency.  The cervix dilates and gets thinner. False labor  Contractions are usually shorter and not as strong as true labor contractions.  Contractions are usually irregular.  Contractions are often felt in the front of the lower abdomen and in the groin.  Contractions may go away when you walk around or change positions while lying down.  Contractions get weaker and are shorter-lasting as time goes on.  The cervix usually does not dilate or become thin. Follow these instructions at home:   Take over-the-counter and prescription medicines only as told by your health care provider.  Keep up with your usual exercises and follow other instructions from your health care provider.  Eat and drink lightly if you think you are going into labor.  If Braxton Hicks contractions are making you uncomfortable: ? Change your position from lying down or resting to walking, or change from walking to resting. ? Sit and rest in a tub of warm water. ? Drink enough fluid to keep your urine pale yellow. Dehydration may cause these contractions. ? Do slow and deep breathing several times an hour.  Keep all follow-up prenatal visits as told by your health care provider. This is important. Contact a health care provider if:  You have a fever.  You have continuous  pain in your abdomen. Get help right away if:  Your contractions become stronger, more regular, and closer together.  You have fluid leaking or gushing from your vagina.  You pass blood-tinged mucus (bloody show).  You have bleeding from your vagina.  You have low back pain that you never had before.  You feel your baby's head pushing down and causing pelvic pressure.  Your baby is not moving inside you as much as it used to. Summary  Contractions that occur before labor are called Braxton Hicks contractions, false labor, or practice contractions.  Braxton Hicks contractions are usually shorter, weaker, farther apart, and less regular than true labor contractions. True labor contractions usually become progressively stronger and regular, and they become more frequent.  Manage discomfort from Braxton Hicks contractions by changing position, resting in a warm bath, drinking plenty of water, or practicing deep breathing. This information is not intended to replace advice given to you by your health care provider. Make sure you discuss any questions you have with your health care provider. Document Released: 10/14/2016 Document Revised: 03/15/2017 Document Reviewed: 10/14/2016 Elsevier Interactive Patient Education  2019 [employer]  

## 2018-10-13 NOTE — Progress Notes (Signed)
Pt is on the phone preparing for Webex visit with provider. ROB, [redacted]w[redacted]d. Babyscripts order sent and pt instructed to check email to activate. Pt has BP cuff at home and is able to check BP while on the phone.

## 2018-10-25 ENCOUNTER — Encounter: Payer: Medicaid Other | Admitting: Certified Nurse Midwife

## 2018-10-31 ENCOUNTER — Other Ambulatory Visit (HOSPITAL_COMMUNITY)
Admission: RE | Admit: 2018-10-31 | Discharge: 2018-10-31 | Disposition: A | Discharge status: Home / Self Care | Payer: Medicaid Other | Source: Ambulatory Visit | Attending: Obstetrics and Gynecology | Admitting: Obstetrics and Gynecology

## 2018-10-31 ENCOUNTER — Ambulatory Visit (HOSPITAL_COMMUNITY)
Admission: RE | Admit: 2018-10-31 | Discharge: 2018-10-31 | Disposition: A | Discharge status: Home / Self Care | Payer: Medicaid Other | Source: Ambulatory Visit | Attending: Obstetrics and Gynecology | Admitting: Obstetrics and Gynecology

## 2018-10-31 ENCOUNTER — Other Ambulatory Visit: Payer: Self-pay

## 2018-10-31 ENCOUNTER — Encounter: Payer: Self-pay | Admitting: Obstetrics and Gynecology

## 2018-10-31 ENCOUNTER — Ambulatory Visit (INDEPENDENT_AMBULATORY_CARE_PROVIDER_SITE_OTHER): Payer: Medicaid Other | Admitting: Obstetrics and Gynecology

## 2018-10-31 VITALS — BP 118/82 | HR 71 | Wt 345.2 lb

## 2018-10-31 DIAGNOSIS — O0990 Supervision of high risk pregnancy, unspecified, unspecified trimester: Secondary | ICD-10-CM | POA: Diagnosis not present

## 2018-10-31 DIAGNOSIS — Z362 Encounter for other antenatal screening follow-up: Secondary | ICD-10-CM | POA: Diagnosis not present

## 2018-10-31 DIAGNOSIS — O99213 Obesity complicating pregnancy, third trimester: Secondary | ICD-10-CM

## 2018-10-31 DIAGNOSIS — O09523 Supervision of elderly multigravida, third trimester: Secondary | ICD-10-CM | POA: Diagnosis not present

## 2018-10-31 DIAGNOSIS — Z3A37 37 weeks gestation of pregnancy: Secondary | ICD-10-CM

## 2018-10-31 DIAGNOSIS — O0993 Supervision of high risk pregnancy, unspecified, third trimester: Secondary | ICD-10-CM

## 2018-10-31 LAB — OB RESULTS CONSOLE GBS: GBS: NEGATIVE

## 2018-10-31 NOTE — Progress Notes (Signed)
Subjective:  Kathleen Dixon is a 38 y.o. G2P1001 at [redacted]w[redacted]d being seen today for ongoing prenatal care.  She is currently monitored for the following issues for this low-risk pregnancy and has BV (bacterial vaginosis); Candidiasis of vulva and vagina; and Encounter for supervision of normal pregnancy, unspecified, unspecified trimester on their problem list.  Patient reports general discomforts of pregnancy.  Contractions: Not present. Vag. Bleeding: None.  Movement: Present. Denies leaking of fluid.   The following portions of the patient's history were reviewed and updated as appropriate: allergies, current medications, past family history, past medical history, past social history, past surgical history and problem list. Problem list updated.  Objective:   Vitals:   10/31/18 0931 10/31/18 0937  BP: (!) 142/89 118/82  Pulse: 69 71  Weight: (!) 345 lb 3.2 oz (156.6 kg)     Fetal Status: Fetal Heart Rate (bpm): 136   Movement: Present     General:  Alert, oriented and cooperative. Patient is in no acute distress.  Skin: Skin is warm and dry. No rash noted.   Cardiovascular: Normal heart rate noted  Respiratory: Normal respiratory effort, no problems with respiration noted  Abdomen: Soft, gravid, appropriate for gestational age. Pain/Pressure: Present     Pelvic:  Cervical exam performed        Extremities: Normal range of motion.  Edema: Trace  Mental Status: Normal mood and affect. Normal behavior. Normal judgment and thought content.   Urinalysis:      Assessment and Plan:  Pregnancy: G2P1001 at [redacted]w[redacted]d  1. Supervision of high risk pregnancy, antepartum Stable Labor precautions - Culture, beta strep (group b only) - Cervicovaginal ancillary only( [place])  Term labor symptoms and general obstetric precautions including but not limited to vaginal bleeding, contractions, leaking of fluid and fetal movement were reviewed in detail with the patient. Please refer to After Visit  Summary for other counseling recommendations.  Return in about 2 weeks (around 11/14/2018) for OB visit, televisit.   Hermina Staggers, MD

## 2018-10-31 NOTE — Patient Instructions (Signed)
Third Trimester of Pregnancy The third trimester is from week 28 through week 40 (months 7 through 9). The third trimester is a time when the unborn baby (fetus) is growing rapidly. At the end of the ninth month, the fetus is about 20 inches in length and weighs 6-10 pounds. Body changes during your third trimester Your body will continue to go through many changes during pregnancy. The changes vary from woman to woman. During the third trimester:  Your weight will continue to increase. You can expect to gain 25-35 pounds (11-16 kg) by the end of the pregnancy.  You may begin to get stretch marks on your hips, abdomen, and breasts.  You may urinate more often because the fetus is moving lower into your pelvis and pressing on your bladder.  You may develop or continue to have heartburn. This is caused by increased hormones that slow down muscles in the digestive tract.  You may develop or continue to have constipation because increased hormones slow digestion and cause the muscles that push waste through your intestines to relax.  You may develop hemorrhoids. These are swollen veins (varicose veins) in the rectum that can itch or be painful.  You may develop swollen, bulging veins (varicose veins) in your legs.  You may have increased body aches in the pelvis, back, or thighs. This is due to weight gain and increased hormones that are relaxing your joints.  You may have changes in your hair. These can include thickening of your hair, rapid growth, and changes in texture. Some women also have hair loss during or after pregnancy, or hair that feels dry or thin. Your hair will most likely return to normal after your baby is born.  Your breasts will continue to grow and they will continue to become tender. A yellow fluid (colostrum) may leak from your breasts. This is the first milk you are producing for your baby.  Your belly button may stick out.  You may notice more swelling in your hands,  face, or ankles.  You may have increased tingling or numbness in your hands, arms, and legs. The skin on your belly may also feel numb.  You may feel short of breath because of your expanding uterus.  You may have more problems sleeping. This can be caused by the size of your belly, increased need to urinate, and an increase in your body's metabolism.  You may notice the fetus "dropping," or moving lower in your abdomen (lightening).  You may have increased vaginal discharge.  You may notice your joints feel loose and you may have pain around your pelvic bone. What to expect at prenatal visits You will have prenatal exams every 2 weeks until week 36. Then you will have weekly prenatal exams. During a routine prenatal visit:  You will be weighed to make sure you and the baby are growing normally.  Your blood pressure will be taken.  Your abdomen will be measured to track your baby's growth.  The fetal heartbeat will be listened to.  Any test results from the previous visit will be discussed.  You may have a cervical check near your due date to see if your cervix has softened or thinned (effaced).  You will be tested for Group B streptococcus. This happens between 35 and 37 weeks. Your health care provider may ask you:  What your birth plan is.  How you are feeling.  If you are feeling the baby move.  If you have had any abnormal   symptoms, such as leaking fluid, bleeding, severe headaches, or abdominal cramping.  If you are using any tobacco products, including cigarettes, chewing tobacco, and electronic cigarettes.  If you have any questions. Other tests or screenings that may be performed during your third trimester include:  Blood tests that check for low iron levels (anemia).  Fetal testing to check the health, activity level, and growth of the fetus. Testing is done if you have certain medical conditions or if there are problems during the pregnancy.  Nonstress test  (NST). This test checks the health of your baby to make sure there are no signs of problems, such as the baby not getting enough oxygen. During this test, a belt is placed around your belly. The baby is made to move, and its heart rate is monitored during movement. What is false labor? False labor is a condition in which you feel small, irregular tightenings of the muscles in the womb (contractions) that usually go away with rest, changing position, or drinking water. These are called Braxton Hicks contractions. Contractions may last for hours, days, or even weeks before true labor sets in. If contractions come at regular intervals, become more frequent, increase in intensity, or become painful, you should see your health care provider. What are the signs of labor?  Abdominal cramps.  Regular contractions that start at 10 minutes apart and become stronger and more frequent with time.  Contractions that start on the top of the uterus and spread down to the lower abdomen and back.  Increased pelvic pressure and dull back pain.  A watery or bloody mucus discharge that comes from the vagina.  Leaking of amniotic fluid. This is also known as your "water breaking." It could be a slow trickle or a gush. Let your health care provider know if it has a color or strange odor. If you have any of these signs, call your health care provider right away, even if it is before your due date. Follow these instructions at home: Medicines  Follow your health care provider's instructions regarding medicine use. Specific medicines may be either safe or unsafe to take during pregnancy.  Take a prenatal vitamin that contains at least 600 micrograms (mcg) of folic acid.  If you develop constipation, try taking a stool softener if your health care provider approves. Eating and drinking   Eat a balanced diet that includes fresh fruits and vegetables, whole grains, good sources of protein such as meat, eggs, or tofu,  and low-fat dairy. Your health care provider will help you determine the amount of weight gain that is right for you.  Avoid raw meat and uncooked cheese. These carry germs that can cause birth defects in the baby.  If you have low calcium intake from food, talk to your health care provider about whether you should take a daily calcium supplement.  Eat four or five small meals rather than three large meals a day.  Limit foods that are high in fat and processed sugars, such as fried and sweet foods.  To prevent constipation: ? Drink enough fluid to keep your urine clear or pale yellow. ? Eat foods that are high in fiber, such as fresh fruits and vegetables, whole grains, and beans. Activity  Exercise only as directed by your health care provider. Most women can continue their usual exercise routine during pregnancy. Try to exercise for 30 minutes at least 5 days a week. Stop exercising if you experience uterine contractions.  Avoid heavy lifting.  Do   not exercise in extreme heat or humidity, or at high altitudes.  Wear low-heel, comfortable shoes.  Practice good posture.  You may continue to have sex unless your health care provider tells you otherwise. Relieving pain and discomfort  Take frequent breaks and rest with your legs elevated if you have leg cramps or low back pain.  Take warm sitz baths to soothe any pain or discomfort caused by hemorrhoids. Use hemorrhoid cream if your health care provider approves.  Wear a good support bra to prevent discomfort from breast tenderness.  If you develop varicose veins: ? Wear support pantyhose or compression stockings as told by your healthcare provider. ? Elevate your feet for 15 minutes, 3-4 times a day. Prenatal care  Write down your questions. Take them to your prenatal visits.  Keep all your prenatal visits as told by your health care provider. This is important. Safety  Wear your seat belt at all times when driving.  Make  a list of emergency phone numbers, including numbers for family, friends, the hospital, and police and fire departments. General instructions  Avoid cat litter boxes and soil used by cats. These carry germs that can cause birth defects in the baby. If you have a cat, ask someone to clean the litter box for you.  Do not travel far distances unless it is absolutely necessary and only with the approval of your health care provider.  Do not use hot tubs, steam rooms, or saunas.  Do not drink alcohol.  Do not use any products that contain nicotine or tobacco, such as cigarettes and e-cigarettes. If you need help quitting, ask your health care provider.  Do not use any medicinal herbs or unprescribed drugs. These chemicals affect the formation and growth of the baby.  Do not douche or use tampons or scented sanitary pads.  Do not cross your legs for long periods of time.  To prepare for the arrival of your baby: ? Take prenatal classes to understand, practice, and ask questions about labor and delivery. ? Make a trial run to the hospital. ? Visit the hospital and tour the maternity area. ? Arrange for maternity or paternity leave through employers. ? Arrange for family and friends to take care of pets while you are in the hospital. ? Purchase a rear-facing car seat and make sure you know how to install it in your car. ? Pack your hospital bag. ? Prepare the baby's nursery. Make sure to remove all pillows and stuffed animals from the baby's crib to prevent suffocation.  Visit your dentist if you have not gone during your pregnancy. Use a soft toothbrush to brush your teeth and be gentle when you floss. Contact a health care provider if:  You are unsure if you are in labor or if your water has broken.  You become dizzy.  You have mild pelvic cramps, pelvic pressure, or nagging pain in your abdominal area.  You have lower back pain.  You have persistent nausea, vomiting, or  diarrhea.  You have an unusual or bad smelling vaginal discharge.  You have pain when you urinate. Get help right away if:  Your water breaks before 37 weeks.  You have regular contractions less than 5 minutes apart before 37 weeks.  You have a fever.  You are leaking fluid from your vagina.  You have spotting or bleeding from your vagina.  You have severe abdominal pain or cramping.  You have rapid weight loss or weight gain.  You have   shortness of breath with chest pain.  You notice sudden or extreme swelling of your face, hands, ankles, feet, or legs.  Your baby makes fewer than 10 movements in 2 hours.  You have severe headaches that do not go away when you take medicine.  You have vision changes. Summary  The third trimester is from week 28 through week 40, months 7 through 9. The third trimester is a time when the unborn baby (fetus) is growing rapidly.  During the third trimester, your discomfort may increase as you and your baby continue to gain weight. You may have abdominal, leg, and back pain, sleeping problems, and an increased need to urinate.  During the third trimester your breasts will keep growing and they will continue to become tender. A yellow fluid (colostrum) may leak from your breasts. This is the first milk you are producing for your baby.  False labor is a condition in which you feel small, irregular tightenings of the muscles in the womb (contractions) that eventually go away. These are called Braxton Hicks contractions. Contractions may last for hours, days, or even weeks before true labor sets in.  Signs of labor can include: abdominal cramps; regular contractions that start at 10 minutes apart and become stronger and more frequent with time; watery or bloody mucus discharge that comes from the vagina; increased pelvic pressure and dull back pain; and leaking of amniotic fluid. This information is not intended to replace advice given to you by your  health care provider. Make sure you discuss any questions you have with your health care provider. Document Released: 05/25/2001 Document Revised: 07/06/2016 Document Reviewed: 07/06/2016 [employer] Interactive Patient Education  2019 [employer] Vaginal Delivery  Vaginal delivery means that you give birth by pushing your baby out of your birth canal (vagina). A team of health care providers will help you before, during, and after vaginal delivery. Birth experiences are unique for every woman and every pregnancy, and birth experiences vary depending on where you choose to give birth. What happens when I arrive at the birth center or hospital? Once you are in labor and have been admitted into the hospital or birth center, your health care provider may:  Review your pregnancy history and any concerns that you have.  Insert an IV into one of your veins. This may be used to give you fluids and medicines.  Check your blood pressure, pulse, temperature, and heart rate (vital signs).  Check whether your bag of water (amniotic sac) has broken (ruptured).  Talk with you about your birth plan and discuss pain control options. Monitoring Your health care provider may monitor your contractions (uterine monitoring) and your baby's heart rate (fetal monitoring). You may need to be monitored:  Often, but not continuously (intermittently).  All the time or for long periods at a time (continuously). Continuous monitoring may be needed if: ? You are taking certain medicines, such as medicine to relieve pain or make your contractions stronger. ? You have pregnancy or labor complications. Monitoring may be done by:  Placing a special stethoscope or a handheld monitoring device on your abdomen to check your baby's heartbeat and to check for contractions.  Placing monitors on your abdomen (external monitors) to record your baby's heartbeat and the frequency and length of contractions.  Placing monitors  inside your uterus through your vagina (internal monitors) to record your baby's heartbeat and the frequency, length, and strength of your contractions. Depending on the type of monitor, it may remain in   your uterus or on your baby's head until birth.  Telemetry. This is a type of continuous monitoring that can be done with external or internal monitors. Instead of having to stay in bed, you are able to move around during telemetry. Physical exam Your health care provider may perform frequent physical exams. This may include:  Checking how and where your baby is positioned in your uterus.  Checking your cervix to determine: ? Whether it is thinning out (effacing). ? Whether it is opening up (dilating). What happens during labor and delivery?  Normal labor and delivery is divided into the following three stages: Stage 1  This is the longest stage of labor.  This stage can last for hours or days.  Throughout this stage, you will feel contractions. Contractions generally feel mild, infrequent, and irregular at first. They get stronger, more frequent (about every 2-3 minutes), and more regular as you move through this stage.  This stage ends when your cervix is completely dilated to 4 inches (10 cm) and completely effaced. Stage 2  This stage starts once your cervix is completely effaced and dilated and lasts until the delivery of your baby.  This stage may last from 20 minutes to 2 hours.  This is the stage where you will feel an urge to push your baby out of your vagina.  You may feel stretching and burning pain, especially when the widest part of your baby's head passes through the vaginal opening (crowning).  Once your baby is delivered, the umbilical cord will be clamped and cut. This usually occurs after waiting a period of 1-2 minutes after delivery.  Your baby will be placed on your bare chest (skin-to-skin contact) in an upright position and covered with a warm blanket. Watch  your baby for feeding cues, like rooting or sucking, and help the baby to your breast for his or her first feeding. Stage 3  This stage starts immediately after the birth of your baby and ends after you deliver the placenta.  This stage may take anywhere from 5 to 30 minutes.  After your baby has been delivered, you will feel contractions as your body expels the placenta and your uterus contracts to control bleeding. What can I expect after labor and delivery?  After labor is over, you and your baby will be monitored closely until you are ready to go home to ensure that you are both healthy. Your health care team will teach you how to care for yourself and your baby.  You and your baby will stay in the same room (rooming in) during your hospital stay. This will encourage early bonding and successful breastfeeding.  You may continue to receive fluids and medicines through an IV.  Your uterus will be checked and massaged regularly (fundal massage).  You will have some soreness and pain in your abdomen, vagina, and the area of skin between your vaginal opening and your anus (perineum).  If an incision was made near your vagina (episiotomy) or if you had some vaginal tearing during delivery, cold compresses may be placed on your episiotomy or your tear. This helps to reduce pain and swelling.  You may be given a squirt bottle to use instead of wiping when you go to the bathroom. To use the squirt bottle, follow these steps: ? Before you urinate, fill the squirt bottle with warm water. Do not use hot water. ? After you urinate, while you are sitting on the toilet, use the squirt bottle to rinse   the area around your urethra and vaginal opening. This rinses away any urine and blood. ? Fill the squirt bottle with clean water every time you use the bathroom.  It is normal to have vaginal bleeding after delivery. Wear a sanitary pad for vaginal bleeding and discharge. Summary  Vaginal delivery  means that you will give birth by pushing your baby out of your birth canal (vagina).  Your health care provider may monitor your contractions (uterine monitoring) and your baby's heart rate (fetal monitoring).  Your health care provider may perform a physical exam.  Normal labor and delivery is divided into three stages.  After labor is over, you and your baby will be monitored closely until you are ready to go home. This information is not intended to replace advice given to you by your health care provider. Make sure you discuss any questions you have with your health care provider. Document Released: 03/09/2008 Document Revised: 07/05/2017 Document Reviewed: 07/05/2017 [employer] Interactive Patient Education  2019 [employer]  

## 2018-10-31 NOTE — Progress Notes (Signed)
ROB pt had growth U/S this morning.

## 2018-11-02 LAB — CERVICOVAGINAL ANCILLARY ONLY
Chlamydia: NEGATIVE
Neisseria Gonorrhea: NEGATIVE

## 2018-11-04 LAB — CULTURE, BETA STREP (GROUP B ONLY): Strep Gp B Culture: NEGATIVE

## 2018-11-14 ENCOUNTER — Encounter: Payer: Self-pay | Admitting: Obstetrics and Gynecology

## 2018-11-14 ENCOUNTER — Telehealth (INDEPENDENT_AMBULATORY_CARE_PROVIDER_SITE_OTHER): Payer: Medicaid Other | Admitting: Obstetrics and Gynecology

## 2018-11-14 DIAGNOSIS — Z3483 Encounter for supervision of other normal pregnancy, third trimester: Secondary | ICD-10-CM

## 2018-11-14 DIAGNOSIS — Z3480 Encounter for supervision of other normal pregnancy, unspecified trimester: Secondary | ICD-10-CM

## 2018-11-14 DIAGNOSIS — Z3A39 39 weeks gestation of pregnancy: Secondary | ICD-10-CM

## 2018-11-14 NOTE — Progress Notes (Signed)
Pt states she is having some LE swelling and lose of mucous plug. Pt made aware of IOL schedule today.

## 2018-11-14 NOTE — Progress Notes (Signed)
TELEHEALTH OBSTETRICS PRENATAL VIRTUAL VIDEO VISIT ENCOUNTER NOTE  [facility]   I connected with Kathleen Junesaneisha Ninh on 11/14/18 at  8:15 AM EDT by MyChart Video Encounter at home and verified that I am speaking with the correct person using two identifiers.   I discussed the limitations, risks, security and privacy concerns of performing an evaluation and management service by telephone and the availability of in person appointments. I also discussed with the patient that there may be a patient responsible charge related to this service. The patient expressed understanding and agreed to proceed. Subjective:  Kathleen Dixon is a 38 y.o. G2P1001 at 9572w2d being seen today for ongoing prenatal care.  She is currently monitored for the following issues for this low-risk pregnancy and has Encounter for supervision of normal pregnancy, unspecified, unspecified trimester on their problem list.  Patient reports general discomforts of pregnancy. Denies HA, visual changes or epigastric pain..  Contractions: Not present. Vag. Bleeding: None.  Movement: Present. Denies any leaking of fluid.   The following portions of the patient's history were reviewed and updated as appropriate: allergies, current medications, past family history, past medical history, past social history, past surgical history and problem list.   Objective:   Vitals:   11/14/18 0823  BP: (!) 161/98  Pulse: 67    Fetal Status:     Movement: Present     General:  Alert, oriented and cooperative. Patient is in no acute distress.  Respiratory: Normal respiratory effort, no problems with respiration noted  Mental Status: Normal mood and affect. Normal behavior. Normal judgment and thought content.  Rest of physical exam deferred due to type of encounter  Imaging: Koreas Mfm Ob Follow Up  Result Date: 10/31/2018 ----------------------------------------------------------------------   OBSTETRICS REPORT                       (Signed Final 10/31/2018 03:51 pm) ---------------------------------------------------------------------- Patient Info  ID #:       [id number]                          D.O.B.:  1981/05/13 (37 yrs)  Name:       Kathleen Dixon               Visit Date: 10/31/2018 08:25 am ---------------------------------------------------------------------- Performed By  Performed By:     Lenise ArenaHannah Bazemore        Ref. Address:     [address]                    [address]                                                             [address]                                                             [place], [place]  [address]  Attending:        Lin Landsman      [facility]                                       [facility]  Referred By:      Brock Bad MD ---------------------------------------------------------------------- Orders   #  Description                          Code         Ordered By   1  [place] MFM OB FOLLOW UP                  60454.09     Noralee Space  ----------------------------------------------------------------------   #  Order #                    Accession #                 Episode #   1  [id number]                  [id number]                  [id number]  ---------------------------------------------------------------------- Indications   Encounter for other antenatal screening        Z36.2   follow-up   Obesity complicating pregnancy, third          O99.213   trimester (BMI 48)   [redacted] weeks gestation of pregnancy                Z3A.97   Advanced maternal age multigravida 33+,        O18.523   third trimester (37y)  ---------------------------------------------------------------------- Vital Signs  Weight (lb): 335                               Height:        5'10"  BMI:         48.06  ---------------------------------------------------------------------- Fetal Evaluation  Num Of Fetuses:         1  Fetal Heart Rate(bpm):  144  Cardiac Activity:       Observed  Presentation:           Cephalic  Placenta:               Anterior  P. Cord Insertion:      Previously Visualized  Amniotic Fluid  AFI FV:      Within normal limits  AFI Sum(cm)     %Tile       Largest Pocket(cm)  8.7             14          6.09  RUQ(cm)                     LUQ(cm)        LLQ(cm)  6.09  0              2.61 ---------------------------------------------------------------------- Biometry  BPD:        91  mm     G. Age:  36w 6d         57  %    CI:        74.91   %    70 - 86                                                          FL/HC:      21.9   %    20.8 - 22.6  HC:      333.6  mm     G. Age:  38w 1d         46  %    HC/AC:      0.96        0.92 - 1.05  AC:       349   mm     G. Age:  38w 6d         93  %    FL/BPD:     80.2   %    71 - 87  FL:         73  mm     G. Age:  37w 3d         52  %    FL/AC:      20.9   %    20 - 24  HUM:      63.7  mm     G. Age:  37w 0d         66  %  Est. FW:    3415  gm      7 lb 8 oz     85  % ---------------------------------------------------------------------- OB History  Gravidity:    2         Term:   1  Living:       1 ---------------------------------------------------------------------- Gestational Age  LMP:           37w 2d        Date:  02/12/18                 EDD:   11/19/18  U/S Today:     37w 6d                                        EDD:   11/15/18  Best:          37w 2d     Det. By:  LMP  (02/12/18)          EDD:   11/19/18 ---------------------------------------------------------------------- Anatomy  Cranium:               Appears normal         Aortic Arch:            Previously seen  Cavum:                 Appears normal         Ductal Arch:            Previously  seen  Ventricles:            Previously seen        Diaphragm:               Previously seen  Choroid Plexus:        Previously seen        Stomach:                Appears normal, left                                                                        sided  Cerebellum:            Previously seen        Abdomen:                Appears normal  Posterior Fossa:       Previously seen        Abdominal Wall:         Previously seen  Nuchal Fold:           Previously seen        Cord Vessels:           Previously seen  Face:                  Orbits and profile     Kidneys:                Appear normal                         previously seen  Lips:                  Previously seen        Bladder:                Appears normal  Thoracic:              Appears normal         Spine:                  Previously seen  Heart:                 Previously seen        Upper Extremities:      Previously seen  RVOT:                  Previously seen        Lower Extremities:      Previously seen  LVOT:                  Previously seen  Other:  Female gender. Technically difficult due to maternal habitus and fetal          position. ---------------------------------------------------------------------- Cervix Uterus Adnexa  Cervix  Not visualized (advanced GA >24wks) ---------------------------------------------------------------------- Impression  Normal interval growth.  AC again at the 93%. ---------------------------------------------------------------------- Recommendations  Follow up as clinically indicated. ----------------------------------------------------------------------               Lin Landsman, MD Electronically Signed Final Report   10/31/2018 03:51 pm ----------------------------------------------------------------------   Assessment and Plan:  Pregnancy: G2P1001 at [redacted]w[redacted]d 1. Supervision of other normal pregnancy, antepartum Repeat BP 157/74 S/Sx of PEC reviewed with pt Pt instructed to check BP daily Nurse visit on Friday for BP check IOL scheduled for 41 weeks presently, may  need to be modified Labor precautions reviewed with pt  Term labor symptoms and general obstetric precautions including but not limited to vaginal bleeding, contractions, leaking of fluid and fetal movement were reviewed in detail with the patient. I discussed the assessment and treatment plan with the patient. The patient was provided an opportunity to ask questions and all were answered. The patient agreed with the plan and demonstrated an understanding of the instructions. The patient was advised to call back or seek an in-person office evaluation/go to [facility]U at [facility] for any urgent or concerning symptoms. Please refer to After Visit Summary for other counseling recommendations.   I provided 8 minutes of face-to-face time during this encounter.  No follow-ups on file.  No future appointments.  Hermina Staggers, MD [facility]

## 2018-11-15 ENCOUNTER — Encounter (HOSPITAL_COMMUNITY): Payer: Self-pay | Admitting: *Deleted

## 2018-11-15 ENCOUNTER — Telehealth (HOSPITAL_COMMUNITY): Payer: Self-pay | Admitting: *Deleted

## 2018-11-15 NOTE — Telephone Encounter (Signed)
Preadmission screen  

## 2018-11-17 ENCOUNTER — Ambulatory Visit (INDEPENDENT_AMBULATORY_CARE_PROVIDER_SITE_OTHER): Payer: Medicaid Other

## 2018-11-17 ENCOUNTER — Other Ambulatory Visit: Payer: Self-pay

## 2018-11-17 VITALS — BP 130/83 | HR 63 | Ht 70.0 in | Wt 336.6 lb

## 2018-11-17 DIAGNOSIS — Z0130 Encounter for examination of blood pressure without abnormal findings: Secondary | ICD-10-CM

## 2018-11-17 NOTE — Progress Notes (Addendum)
Swollen ankles with slight pitting.  Subjective:  Kathleen Dixon is a 38 y.o. female here for BP check.   Hypertension ROS: Swelling of ankles with slight pitting.    Objective:  BP 130/83   Pulse 63   Ht 5\' 10"  (1.778 m)   Wt (!) 336 lb 9.6 oz (152.7 kg)   LMP 02/12/2018   BMI 48.30 kg/m   Appearance alert, well appearing, and in no distress. General exam BP noted to be well controlled today in office.    Assessment:   Blood Pressure well controlled.   Plan:  Current treatment plan is effective, no change in therapy. per Dr. Clearance Coots.   I have reviewed the chart and agree with nursing staff's documentation of this patient's encounter.  Coral Ceo, MD 11/17/2018 11:35 AM

## 2018-11-19 ENCOUNTER — Other Ambulatory Visit: Payer: Self-pay | Admitting: Advanced Practice Midwife

## 2018-11-21 ENCOUNTER — Encounter: Payer: Medicaid Other | Admitting: Obstetrics & Gynecology

## 2018-11-23 ENCOUNTER — Encounter: Payer: Medicaid Other | Admitting: Advanced Practice Midwife

## 2018-11-23 ENCOUNTER — Other Ambulatory Visit (HOSPITAL_COMMUNITY)
Admission: RE | Admit: 2018-11-23 | Discharge: 2018-11-23 | Disposition: A | Discharge status: Home / Self Care | Payer: Medicaid Other | Source: Ambulatory Visit

## 2018-11-23 NOTE — MAU Note (Cosign Needed)
Pt called in, spoke to Francisco Capuchin, Therapist, sports.  Had overslept, wanting to reschedule.  Pt informed we do not do the scheduling, she just needs to come in, pt states "I don't think I will"

## 2018-11-24 ENCOUNTER — Other Ambulatory Visit (HOSPITAL_COMMUNITY): Payer: Self-pay | Admitting: *Deleted

## 2018-11-26 ENCOUNTER — Encounter (HOSPITAL_COMMUNITY): Payer: Self-pay | Admitting: *Deleted

## 2018-11-26 ENCOUNTER — Inpatient Hospital Stay (HOSPITAL_COMMUNITY): Payer: Medicaid Other | Admitting: Anesthesiology

## 2018-11-26 ENCOUNTER — Inpatient Hospital Stay (HOSPITAL_COMMUNITY): Payer: Medicaid Other

## 2018-11-26 ENCOUNTER — Other Ambulatory Visit: Payer: Self-pay

## 2018-11-26 ENCOUNTER — Inpatient Hospital Stay (HOSPITAL_COMMUNITY)
Admission: AD | Admit: 2018-11-26 | Discharge: 2018-11-28 | DRG: 807 | Disposition: A | Discharge status: Home / Self Care | Payer: Medicaid Other | Attending: Obstetrics & Gynecology | Admitting: Obstetrics & Gynecology

## 2018-11-26 DIAGNOSIS — Z1159 Encounter for screening for other viral diseases: Secondary | ICD-10-CM

## 2018-11-26 DIAGNOSIS — O480 Post-term pregnancy: Principal | ICD-10-CM | POA: Diagnosis present

## 2018-11-26 DIAGNOSIS — O99334 Smoking (tobacco) complicating childbirth: Secondary | ICD-10-CM | POA: Diagnosis present

## 2018-11-26 DIAGNOSIS — O99214 Obesity complicating childbirth: Secondary | ICD-10-CM | POA: Diagnosis present

## 2018-11-26 DIAGNOSIS — O134 Gestational [pregnancy-induced] hypertension without significant proteinuria, complicating childbirth: Secondary | ICD-10-CM | POA: Diagnosis present

## 2018-11-26 DIAGNOSIS — Z3A41 41 weeks gestation of pregnancy: Secondary | ICD-10-CM

## 2018-11-26 DIAGNOSIS — F17200 Nicotine dependence, unspecified, uncomplicated: Secondary | ICD-10-CM | POA: Diagnosis present

## 2018-11-26 DIAGNOSIS — Z3480 Encounter for supervision of other normal pregnancy, unspecified trimester: Secondary | ICD-10-CM

## 2018-11-26 LAB — COMPREHENSIVE METABOLIC PANEL
ALT: 11 U/L (ref 0–44)
AST: 15 U/L (ref 15–41)
Albumin: 2.7 g/dL — ABNORMAL LOW (ref 3.5–5.0)
Alkaline Phosphatase: 125 U/L (ref 38–126)
Anion gap: 6 (ref 5–15)
BUN: 12 mg/dL (ref 6–20)
CO2: 22 mmol/L (ref 22–32)
Calcium: 8.6 mg/dL — ABNORMAL LOW (ref 8.9–10.3)
Chloride: 108 mmol/L (ref 98–111)
Creatinine, Ser: 0.89 mg/dL (ref 0.44–1.00)
GFR calc Af Amer: >60 mL/min (ref >60)
GFR calc non Af Amer: >60 mL/min (ref >60)
Glucose, Bld: 81 mg/dL (ref 70–99)
Potassium: 3.8 mmol/L (ref 3.5–5.1)
Sodium: 136 mmol/L (ref 135–145)
Total Bilirubin: 0.5 mg/dL (ref 0.3–1.2)
Total Protein: 6.3 g/dL — ABNORMAL LOW (ref 6.5–8.1)

## 2018-11-26 LAB — CBC
HCT: 32.3 % — ABNORMAL LOW (ref 36.0–46.0)
HCT: 33.4 % — ABNORMAL LOW (ref 36.0–46.0)
Hemoglobin: 10.9 g/dL — ABNORMAL LOW (ref 12.0–15.0)
Hemoglobin: 10.9 g/dL — ABNORMAL LOW (ref 12.0–15.0)
MCH: 32.7 pg (ref 26.0–34.0)
MCH: 31.4 pg (ref 26.0–34.0)
MCHC: 33.7 g/dL (ref 30.0–36.0)
MCHC: 32.6 g/dL (ref 30.0–36.0)
MCV: 97 fL (ref 80.0–100.0)
MCV: 96.3 fL (ref 80.0–100.0)
Platelets: 255 10*3/uL (ref 150–400)
Platelets: 252 10*3/uL (ref 150–400)
RBC: 3.33 MIL/uL — ABNORMAL LOW (ref 3.87–5.11)
RBC: 3.47 MIL/uL — ABNORMAL LOW (ref 3.87–5.11)
RDW: 15.4 % (ref 11.5–15.5)
RDW: 15.2 % (ref 11.5–15.5)
WBC: 10.9 10*3/uL — ABNORMAL HIGH (ref 4.0–10.5)
WBC: 12.8 10*3/uL — ABNORMAL HIGH (ref 4.0–10.5)
nRBC: 0 % (ref 0.0–0.2)
nRBC: 0 % (ref 0.0–0.2)

## 2018-11-26 LAB — COMPREHENSIVE METABOLIC PANEL WITH GFR
ALT: 11 U/L (ref 0–44)
AST: 20 U/L (ref 15–41)
Albumin: 2.7 g/dL — ABNORMAL LOW (ref 3.5–5.0)
Alkaline Phosphatase: 132 U/L — ABNORMAL HIGH (ref 38–126)
Anion gap: 7 (ref 5–15)
BUN: 15 mg/dL (ref 6–20)
CO2: 21 mmol/L — ABNORMAL LOW (ref 22–32)
Calcium: 8.6 mg/dL — ABNORMAL LOW (ref 8.9–10.3)
Chloride: 109 mmol/L (ref 98–111)
Creatinine, Ser: 1.18 mg/dL — ABNORMAL HIGH (ref 0.44–1.00)
GFR calc Af Amer: >60 mL/min
GFR calc non Af Amer: 59 mL/min — ABNORMAL LOW
Glucose, Bld: 121 mg/dL — ABNORMAL HIGH (ref 70–99)
Potassium: 4.4 mmol/L (ref 3.5–5.1)
Sodium: 137 mmol/L (ref 135–145)
Total Bilirubin: 0.6 mg/dL (ref 0.3–1.2)
Total Protein: 5.8 g/dL — ABNORMAL LOW (ref 6.5–8.1)

## 2018-11-26 LAB — PROTEIN / CREATININE RATIO, URINE
Creatinine, Urine: 126.91 mg/dL
Protein Creatinine Ratio: 0.08 mg/mg{creat} (ref 0.00–0.15)
Total Protein, Urine: 10 mg/dL

## 2018-11-26 LAB — TYPE AND SCREEN
ABO/RH(D): O POS
Antibody Screen: NEGATIVE

## 2018-11-26 LAB — ABO/RH: ABO/RH(D): O POS

## 2018-11-26 LAB — SARS CORONAVIRUS 2: SARS Coronavirus 2: NOT DETECTED

## 2018-11-26 MED ORDER — LIDOCAINE HCL (PF) 1 % IJ SOLN: 30.0000 mL | INTRAMUSCULAR | Status: DC | PRN

## 2018-11-26 MED ORDER — ONDANSETRON HCL 4 MG/2ML IJ SOLN
4.0000 mg | Freq: Four times a day (QID) | INTRAMUSCULAR | Status: DC | PRN
  Administered 2018-11-26: 4 mg via INTRAVENOUS
  Filled 2018-11-26: qty 2

## 2018-11-26 MED ORDER — OXYTOCIN 40 UNITS IN NORMAL SALINE INFUSION - SIMPLE MED
2.5000 [IU]/h | INTRAVENOUS | Status: DC
  Administered 2018-11-27: 2.5 [IU]/h via INTRAVENOUS
  Filled 2018-11-26: qty 1000

## 2018-11-26 MED ORDER — SOD CITRATE-CITRIC ACID 500-334 MG/5ML PO SOLN: 30.0000 mL | ORAL | Status: DC | PRN

## 2018-11-26 MED ORDER — LACTATED RINGERS IV SOLN
500.0000 mL | INTRAVENOUS | Status: DC | PRN
  Administered 2018-11-26: 1000 mL via INTRAVENOUS

## 2018-11-26 MED ORDER — PHENYLEPHRINE 40 MCG/ML (10ML) SYRINGE FOR IV PUSH (FOR BLOOD PRESSURE SUPPORT): 80.0000 ug | PREFILLED_SYRINGE | INTRAVENOUS | Status: DC | PRN

## 2018-11-26 MED ORDER — LACTATED RINGERS IV SOLN
500.0000 mL | Freq: Once | INTRAVENOUS | Status: AC
  Administered 2018-11-26: 500 mL via INTRAVENOUS

## 2018-11-26 MED ORDER — LABETALOL HCL 5 MG/ML IV SOLN: 80.0000 mg | INTRAVENOUS | Status: DC | PRN

## 2018-11-26 MED ORDER — SODIUM CHLORIDE (PF) 0.9 % IJ SOLN
INTRAMUSCULAR | Status: DC | PRN
  Administered 2018-11-26: 12 mL/h via EPIDURAL

## 2018-11-26 MED ORDER — MISOPROSTOL 25 MCG QUARTER TABLET
25.0000 ug | ORAL_TABLET | ORAL | Status: DC | PRN
  Administered 2018-11-26 (×2): 25 ug via VAGINAL
  Filled 2018-11-26 (×2): qty 1

## 2018-11-26 MED ORDER — OXYTOCIN 40 UNITS IN NORMAL SALINE INFUSION - SIMPLE MED
1.0000 m[IU]/min | INTRAVENOUS | Status: DC
  Administered 2018-11-26: 2 m[IU]/min via INTRAVENOUS

## 2018-11-26 MED ORDER — HYDRALAZINE HCL 20 MG/ML IJ SOLN: 10.0000 mg | INTRAMUSCULAR | Status: DC | PRN

## 2018-11-26 MED ORDER — FENTANYL CITRATE (PF) 100 MCG/2ML IJ SOLN
INTRAMUSCULAR | Status: DC | PRN
  Administered 2018-11-26: 100 ug via EPIDURAL

## 2018-11-26 MED ORDER — LABETALOL HCL 5 MG/ML IV SOLN: 40.0000 mg | INTRAVENOUS | Status: DC | PRN

## 2018-11-26 MED ORDER — LABETALOL HCL 5 MG/ML IV SOLN: 20.0000 mg | INTRAVENOUS | Status: DC | PRN

## 2018-11-26 MED ORDER — LIDOCAINE HCL (PF) 1 % IJ SOLN
INTRAMUSCULAR | Status: DC | PRN
  Administered 2018-11-26: 7 mL via EPIDURAL
  Administered 2018-11-26: 3 mL via EPIDURAL
  Administered 2018-11-26: 10 mL

## 2018-11-26 MED ORDER — LACTATED RINGERS AMNIOINFUSION
INTRAVENOUS | Status: DC
  Administered 2018-11-26: 22:00:00 via INTRAUTERINE

## 2018-11-26 MED ORDER — OXYTOCIN BOLUS FROM INFUSION
500.0000 mL | Freq: Once | INTRAVENOUS | Status: AC
  Administered 2018-11-27: 500 mL via INTRAVENOUS

## 2018-11-26 MED ORDER — ACETAMINOPHEN 325 MG PO TABS: 650.0000 mg | ORAL_TABLET | ORAL | Status: DC | PRN

## 2018-11-26 MED ORDER — OXYCODONE-ACETAMINOPHEN 5-325 MG PO TABS: 2.0000 | ORAL_TABLET | ORAL | Status: DC | PRN

## 2018-11-26 MED ORDER — FENTANYL-BUPIVACAINE-NACL 0.5-0.125-0.9 MG/250ML-% EP SOLN
12.0000 mL/h | EPIDURAL | Status: DC | PRN
  Filled 2018-11-26: qty 250

## 2018-11-26 MED ORDER — LACTATED RINGERS IV SOLN
INTRAVENOUS | Status: DC
  Administered 2018-11-26: 08:00:00 via INTRAVENOUS

## 2018-11-26 MED ORDER — TERBUTALINE SULFATE 1 MG/ML IJ SOLN
0.2500 mg | Freq: Once | INTRAMUSCULAR | Status: AC | PRN
  Administered 2018-11-26: 0.25 mg via SUBCUTANEOUS
  Filled 2018-11-26: qty 1

## 2018-11-26 MED ORDER — EPHEDRINE 5 MG/ML INJ: 10.0000 mg | INTRAVENOUS | Status: DC | PRN

## 2018-11-26 MED ORDER — OXYCODONE-ACETAMINOPHEN 5-325 MG PO TABS: 1.0000 | ORAL_TABLET | ORAL | Status: DC | PRN

## 2018-11-26 MED ORDER — FENTANYL CITRATE (PF) 100 MCG/2ML IJ SOLN
50.0000 ug | INTRAMUSCULAR | Status: DC | PRN
  Filled 2018-11-26: qty 2

## 2018-11-26 MED ORDER — DIPHENHYDRAMINE HCL 50 MG/ML IJ SOLN: 12.5000 mg | INTRAMUSCULAR | Status: DC | PRN

## 2018-11-26 MED ORDER — PHENYLEPHRINE 40 MCG/ML (10ML) SYRINGE FOR IV PUSH (FOR BLOOD PRESSURE SUPPORT)
80.0000 ug | PREFILLED_SYRINGE | INTRAVENOUS | Status: DC | PRN
  Filled 2018-11-26: qty 10

## 2018-11-26 NOTE — H&P (Signed)
OBSTETRIC ADMISSION HISTORY AND PHYSICAL  Kathleen Dixon is a 38 y.o. female G2P1001 with IUP at [redacted]w[redacted]d by L/13 presenting for induction of labor for postdates.   Reports fetal movement. Denies vaginal bleeding, leakage of fluids.  She received her prenatal care at [facility].  Support person in labor: Husband, FOB  Ultrasounds . 13w5: dating/viability U/S . 24w1: anterior placenta, no fetal anomalies identified, EFW 79% . 33w3: anterior placenta, EFW > 90% (2830g) . 37w2: EFW 3415g (85%), AC 93%  Prenatal History/Complications: . AMA . Elevated blood pressures - prenatal and on admit, labs pending   Past Medical History: Past Medical History:  Diagnosis Date  . Diabetes (Cataract) 2004    Past Surgical History: Past Surgical History:  Procedure Laterality Date  . ANKLE SURGERY    . HIP SURGERY    . LEG SURGERY      Obstetrical History: OB History    Gravida  2   Para  1   Term  1   Preterm  0   AB  0   Living  1     SAB  0   TAB  0   Ectopic  0   Multiple  0   Live Births  1           Social History: Social History   Socioeconomic History  . Marital status: Single    Spouse name: Not on file  . Number of children: Not on file  . Years of education: Not on file  . Highest education level: Not on file  Occupational History  . Not on file  Social Needs  . Financial resource strain: Not hard at all  . Food insecurity    Worry: Never true    Inability: Never true  . Transportation needs    Medical: No    Non-medical: Not on file  Tobacco Use  . Smoking status: Current Some Day Smoker  . Smokeless tobacco: Never Used  . Tobacco comment: not since confirmed pregnancy  Substance and Sexual Activity  . Alcohol use: Not Currently    Comment: not since confirmed pregnancy  . Drug use: No  . Sexual activity: Yes    Partners: Male    Birth control/protection: Condom  Lifestyle  . Physical activity    Days per week: Not on file     Minutes per session: Not on file  . Stress: Not at all  Relationships  . Social Herbalist on phone: Not on file    Gets together: Not on file    Attends religious service: Not on file    Active member of club or organization: Not on file    Attends meetings of clubs or organizations: Not on file    Relationship status: Not on file  Other Topics Concern  . Not on file  Social History Narrative  . Not on file    Family History: Family History  Problem Relation Age of Onset  . Diabetes Mother   . Hypertension Mother   . Diabetes Father   . Hypertension Father     Allergies: No Known Allergies  Medications Prior to Admission  Medication Sig Dispense Refill Last Dose  . Elastic Bandages & Supports (COMFORT FIT MATERNITY SUPP SM) MISC Wear as directed. (Patient not taking: Reported on 10/13/2018) 1 each 0   . ferrous sulfate 325 (65 FE) MG tablet Take 1 tablet (325 mg total) by mouth 2 (two) times daily with a meal. 60  tablet 5   . Prenat w/o A-FeCbn-Meth-FA-DHA (PRENATE MINI) 29-0.6-0.4-350 MG CAPS Take 1 capsule by mouth daily before breakfast. 90 capsule 4      Review of Systems  All systems reviewed and negative except as stated in HPI  Blood pressure (!) 131/91, pulse 76, temperature 99 F (37.2 C), temperature source Oral, resp. rate 20, height 5\' 10"  (1.778 m), weight (!) 154.2 kg, last menstrual period 02/12/2018. General appearance: alert, well-appearing, NAD Lungs: no respiratory distress Heart: regular rate  Abdomen: soft, non-tender; gravid  Pelvic: deferred Extremities: no significant LE edema  Presentation: cephalic by BSUS Fetal monitoring: 135/mod/+a/+d (one variable with contraction) Uterine activity: irritability     Prenatal labs: ABO, Rh: --/--/PENDING (06/14 16100808) Antibody: PENDING (06/14 96040808) Rubella: 1.21 (11/25 1019) RPR: Non Reactive (04/15 0815)  HBsAg: Negative (11/25 1019)  HIV: Non Reactive (04/15 0815)  GBS: Negative (05/19  0000)  Glucola: normal 2-hr Genetic screening:  Low risk NIPS, negative AFP screen   Prenatal Transfer Tool  Maternal Diabetes: No Genetic Screening: Normal Maternal Ultrasounds/Referrals: Normal Fetal Ultrasounds or other Referrals:  None Maternal Substance Abuse:  No Significant Maternal Medications:  None Significant Maternal Lab Results: None  Results for orders placed or performed during the hospital encounter of 11/26/18 (from the past 24 hour(s))  Type and screen   Collection Time: 11/26/18  8:08 AM  Result Value Ref Range   ABO/RH(D) PENDING    Antibody Screen PENDING    Sample Expiration      11/29/2018,2359 Performed at [facility], [address] [place], [place] [address]     Patient Active Problem List   Diagnosis Date Noted  . Post-dates pregnancy 11/26/2018  . Encounter for supervision of normal pregnancy, unspecified, unspecified trimester 05/08/2018    Assessment/Plan:  Lorn Junesaneisha Hoerner is a 38 y.o. G2P1001 at 364w0d here for postdates induction of labor.  Labor: Induction to start with cytotec, will try to place FB with next check. -- pain control: open to options, had epidural with last pregnancy  Fetal Wellbeing: EFW 3415g (85%) at 37w2. Cephalic by BSUS.  -- GBS (negative) -- continuous fetal monitoring - category I   Elevated BP: Once in prenatal care and on admission. Asymptomatic. Will continue to monitor closely.  -- check CBC, CMP, UPC  Postpartum Planning -- breast/declines -- RI/[declined]Tdap   Brant Peets S. Earlene PlaterWallace, DO OB/GYN Fellow

## 2018-11-26 NOTE — Progress Notes (Signed)
OB/GYN Faculty Practice: Labor Progress Note  Subjective: Not feeling well, has been throwing up. Thinking about getting epidural. Feeling lots of pressure in pelvic area.   Objective: BP (!) 120/57   Pulse 67   Temp 98.4 F (36.9 C) (Oral)   Resp 16   Ht 5\' 10"  (1.778 m)   Wt (!) 154.2 kg   LMP 02/12/2018   BMI 48.78 kg/m  Gen: tired-appearing, uncomfortable during contractions Dilation: 5 Effacement (%): 70 Cervical Position: Middle Station: -2 Presentation: Vertex Exam by:: Dr Juleen China  Assessment and Plan: Kathleen Dixon is a 38 y.o. G2P1001 at [redacted]w[redacted]d here for postdates induction of labor.  Labor: Progressing well. Discussed option for AROM to help with continuing to augment labor, included risks/benefits and patient amenable. AROM clear fluid.  -- pain control: thinks she wants to get epidural  Fetal Wellbeing: EFW 3415g (85%) at 37w2, Leopold's limited by body habitus. Cephalic by BSUS.  -- GBS (negative) -- continuous fetal monitoring - category II - having variables since AROM, FSE placed, will try bolus and position changes   Gestational HTN: Asymptomatic. Given persistent elevated BP here and in prenatal care, meets diagnostic criteria for gHTN.  UPC 0.08, Cr 1.18, AST/ALT wnl, plt 255. Last Cr from 2016 - 0.95.  -- continue to monitor BP closely -- repeat PIH labs stable - Cr improved to 0.89   Yerachmiel Spinney S. Juleen China, DO OB/GYN Fellow, Faculty Practice  7:09 PM

## 2018-11-26 NOTE — Progress Notes (Signed)
Nurse at bedside for 14 minutes attempting to get fhr to trace on monitor

## 2018-11-26 NOTE — Progress Notes (Signed)
Ultrasound not available.  Dr Danley Danker will examine patient after attending c/section

## 2018-11-26 NOTE — Progress Notes (Signed)
Unable to reach cervix or determine presentation

## 2018-11-26 NOTE — Progress Notes (Signed)
Pt had emesis of 250 cc.  Zofran offerred

## 2018-11-26 NOTE — Progress Notes (Signed)
Labor Progress Note  Subjective: Called by [employer]. IUPC fell out. Pt is comfortable with epidural  Objective: BP 139/79   Pulse 70   Temp 98.1 F (36.7 C) (Oral)   Resp 18   Ht 5\' 10"  (1.778 m)   Wt (!) 154.2 kg   LMP 02/12/2018   BMI 48.78 kg/m  Gen: alert, cooperative, no distress  Dilation: 5.5 Effacement (%): 90 Cervical Position: Middle Station: -1 Presentation: Vertex Exam by:: Thornton Dohrmann SNM  Assessment and Plan: 38 y.o. G2P1001 [redacted]w[redacted]d admitted for IOL for postdates.   Labor:  -- IUPC replaced -- Low dose Pitocin started -- Pain control: epidural -- PPH Risk: medium  Fetal Well-Being:  -- Category 2; FHR 125; moderate variability, +accels, + early/variable decels -- Continuous fetal monitoring    Maryagnes Amos, SNM 11:18 PM

## 2018-11-26 NOTE — Progress Notes (Signed)
Pt vomitted on self and floor.  Pt then up to bathroom and insisted on a quick shower.  RN assisted with shower, drying and clean gown.  Then assisted back to bed and monitors reapplied

## 2018-11-26 NOTE — Progress Notes (Signed)
Pt sitting up at bedside.

## 2018-11-26 NOTE — Anesthesia Preprocedure Evaluation (Signed)
Anesthesia Evaluation  Patient identified by MRN, date of birth, ID band Patient awake    Reviewed: Allergy & Precautions, H&P , NPO status , Patient's Chart, lab work & pertinent test results  Airway Mallampati: III  TM Distance: >3 FB Neck ROM: full    Dental no notable dental hx. (+) Teeth Intact   Pulmonary neg pulmonary ROS, Current Smoker,    Pulmonary exam normal breath sounds clear to auscultation       Cardiovascular hypertension, Normal cardiovascular exam Rhythm:regular Rate:Normal     Neuro/Psych negative neurological ROS  negative psych ROS   GI/Hepatic negative GI ROS, Neg liver ROS,   Endo/Other  diabetesMorbid obesity  Renal/GU negative Renal ROS  negative genitourinary   Musculoskeletal   Abdominal (+) + obese,   Peds  Hematology negative hematology ROS (+)   Anesthesia Other Findings   Reproductive/Obstetrics (+) Pregnancy                             Anesthesia Physical Anesthesia Plan  ASA: III  Anesthesia Plan: Epidural   Post-op Pain Management:    Induction:   PONV Risk Score and Plan:   Airway Management Planned:   Additional Equipment:   Intra-op Plan:   Post-operative Plan:   Informed Consent: I have reviewed the patients History and Physical, chart, labs and discussed the procedure including the risks, benefits and alternatives for the proposed anesthesia with the patient or authorized representative who has indicated his/her understanding and acceptance.       Plan Discussed with:   Anesthesia Plan Comments:         Anesthesia Quick Evaluation

## 2018-11-26 NOTE — Progress Notes (Signed)
Nurse at beside for 14 minutes adjusting monitors to obtain fhr tracing

## 2018-11-26 NOTE — Anesthesia Procedure Notes (Signed)
Epidural Patient location during procedure: OB Start time: 11/26/2018 7:37 PM End time: 11/26/2018 7:42 PM  Staffing Anesthesiologist: Lyn Hollingshead, MD Performed: anesthesiologist   Preanesthetic Checklist Completed: patient identified, site marked, surgical consent, pre-op evaluation, timeout performed, IV checked, risks and benefits discussed and monitors and equipment checked  Epidural Patient position: sitting Prep: site prepped and draped and DuraPrep Patient monitoring: continuous pulse ox and blood pressure Approach: midline Location: L3-L4 Injection technique: LOR air  Needle:  Needle type: Tuohy  Needle gauge: 17 G Needle length: 9 cm and 9 Needle insertion depth: 9 cm Catheter type: closed end flexible Catheter size: 19 Gauge Catheter at skin depth: 15 cm Test dose: negative and Other  Assessment Sensory level: T10 Events: blood not aspirated, injection not painful, no injection resistance, negative IV test and no paresthesia  Additional Notes Reason for block:procedure for pain

## 2018-11-26 NOTE — Progress Notes (Signed)
OB/GYN Faculty Practice: Labor Progress Note  Subjective: Doing well, starting to feel contractions off/on. Denies headaches, vision changes, shortness of breath.   Objective: BP 119/65   Pulse (!) 58   Temp 98.3 F (36.8 C) (Oral)   Resp 16   Ht 5\' 10"  (1.778 m)   Wt (!) 154.2 kg   LMP 02/12/2018   BMI 48.78 kg/m  Gen: well-appearing, uncomfortable during contractions Dilation: 3 Effacement (%): 70 Cervical Position: Middle Station: Ballotable Presentation: Vertex Exam by:: s grindstaff rn   Assessment and Plan: Kathleen Dixon is a 38 y.o. G2P1001 at [redacted]w[redacted]d here for postdates induction of labor.  Labor: Has received two doses of cytotec with good cervical change. Will hopefully be able to start pitocin with next check.  -- pain control: open to options, had epidural with last pregnancy  Fetal Wellbeing: EFW 3415g (85%) at 37w2, Leopold's limited by body habitus. Cephalic by BSUS.  -- GBS (negative) -- continuous fetal monitoring - category I   Gestational HTN: Asymptomatic. Given persistent elevated BP here and in prenatal care, meets diagnostic criteria for gHTN.  UPC 0.08, Cr 1.18, AST/ALT wnl, plt 255. Last Cr from 2016 - 0.95.  -- continue to monitor BP closely -- repeat PIH labs later this afternoon - if Cr continuing to increase despite IVF would be more suspicious for preeclampsia and would consider Mg++   Kathleen Benard S. Juleen China, DO OB/GYN Fellow, Faculty Practice  4:29 PM

## 2018-11-26 NOTE — Progress Notes (Addendum)
Labor Progress Note  Subjective: In to assess patient. Pt has an epidural, but still feeling some contraction pain. Feels like it is starting to improve.   Objective: BP (!) 167/117   Pulse 78   Temp 98.4 F (36.9 C) (Oral)   Resp 18   Ht 5\' 10"  (1.778 m)   Wt (!) 154.2 kg   LMP 02/12/2018   BMI 48.78 kg/m  Gen: alert, cooperative, well-appearing  Dilation: 5.5 Effacement (%): 80 Cervical Position: Middle Station: -1 Presentation: Vertex Exam by:: Betha Loa RN  Assessment and Plan: 38 y.o. G2P1001 [redacted]w[redacted]d admitted for IOL for postdates. Discussed placing IUPC and starting amnioinfusion with patient. Risks/benefits discussed. Pt gives verbal consent.  Labor:  -- IUPC placed and amnioinfusion started at 250cc bolus followed by 150cc/hr for recurrent variables -- Pain control: epidural -- PPH Risk: medium  Fetal Well-Being:  -- Cephalic by cervical exam -- Category 2; FHR 135, moderate variability, no accels, recurrent variable decels -- Continuous fetal monitoring  -- GBS negative  Maryagnes Amos, SNM 9:20 PM   CNM at bedside @2120 , RN called about continued fetal decelerations with contractions  FHR: 140/ moderate variability/ +accels/ recurrent decelerations  Toco: 2-3 minutes/ strong by palpation   Terb given @2123 , MVU +300  Amnio fluid bolus x2 and IV fluid bolus given   CRNA at bedside to redose epidural  Dr Elonda Husky notified and aware @ 2135  Lajean Manes, [place] 11/26/18, 9:42 PM

## 2018-11-26 NOTE — Progress Notes (Signed)
OB/GYN Faculty Practice: Labor Progress Note  Subjective: Strip note. Plan of care discussed with RN - okay to have lunch.  Objective: BP (!) 140/94   Pulse 73   Temp 98.4 F (36.9 C) (Oral)   Resp 16   Ht 5\' 10"  (1.778 m)   Wt (!) 154.2 kg   LMP 02/12/2018   BMI 48.78 kg/m  Gen: strip note Dilation: Fingertip Effacement (%): Thick Cervical Position: Posterior Station: Ballotable Presentation: Vertex Exam by:: Dr Danley Danker  Assessment and Plan: Kathleen Dixon is a 38 y.o. G2P1001 at [redacted]w[redacted]d here for postdates induction of labor.  Labor: Induction started with cytotec, will hopefully be able to place FB with next check.  -- pain control: open to options, had epidural with last pregnancy  Fetal Wellbeing: EFW 3415g (85%) at 37w2. Cephalic by BSUS.  -- GBS (negative) -- continuous fetal monitoring - category I   Gestational HTN: Asymptomatic. Given persistent elevated BP here and in prenatal care, meets diagnostic criteria for gHTN.  UPC 0.08, Cr 1.18, AST/ALT wnl, plt 255. Last Cr from 2016 - 0.95.  -- continue to monitor BP closely -- repeat PIH labs later this afternoon - if Cr continuing to increase despite IVF would be more suspicious for preeclampsia and would consider Mg++   Kathleen Bagheri S. Juleen China, DO OB/GYN Fellow, Faculty Practice  12:49 PM

## 2018-11-27 ENCOUNTER — Encounter (HOSPITAL_COMMUNITY): Payer: Self-pay | Admitting: Certified Nurse Midwife

## 2018-11-27 DIAGNOSIS — Z3A41 41 weeks gestation of pregnancy: Secondary | ICD-10-CM

## 2018-11-27 DIAGNOSIS — O480 Post-term pregnancy: Secondary | ICD-10-CM

## 2018-11-27 LAB — RPR: RPR Ser Ql: NONREACTIVE

## 2018-11-27 MED ORDER — IBUPROFEN 600 MG PO TABS
600.0000 mg | ORAL_TABLET | Freq: Four times a day (QID) | ORAL | Status: DC
  Administered 2018-11-27 – 2018-11-28 (×6): 600 mg via ORAL
  Filled 2018-11-27 (×6): qty 1

## 2018-11-27 MED ORDER — DIBUCAINE (PERIANAL) 1 % EX OINT: 1.0000 "application " | TOPICAL_OINTMENT | CUTANEOUS | Status: DC | PRN

## 2018-11-27 MED ORDER — SIMETHICONE 80 MG PO CHEW: 80.0000 mg | CHEWABLE_TABLET | ORAL | Status: DC | PRN

## 2018-11-27 MED ORDER — LIDOCAINE-EPINEPHRINE (PF) 2 %-1:200000 IJ SOLN
INTRAMUSCULAR | Status: DC | PRN
  Administered 2018-11-27 (×2): 5 mL via EPIDURAL

## 2018-11-27 MED ORDER — ONDANSETRON HCL 4 MG PO TABS: 4.0000 mg | ORAL_TABLET | ORAL | Status: DC | PRN

## 2018-11-27 MED ORDER — DIPHENHYDRAMINE HCL 25 MG PO CAPS: 25.0000 mg | ORAL_CAPSULE | Freq: Four times a day (QID) | ORAL | Status: DC | PRN

## 2018-11-27 MED ORDER — BENZOCAINE-MENTHOL 20-0.5 % EX AERO: 1.0000 "application " | INHALATION_SPRAY | CUTANEOUS | Status: DC | PRN

## 2018-11-27 MED ORDER — COCONUT OIL OIL: 1.0000 "application " | TOPICAL_OIL | Status: DC | PRN

## 2018-11-27 MED ORDER — PRENATAL MULTIVITAMIN CH
1.0000 | ORAL_TABLET | Freq: Every day | ORAL | Status: DC
  Administered 2018-11-27 – 2018-11-28 (×2): 1 via ORAL
  Filled 2018-11-27 (×2): qty 1

## 2018-11-27 MED ORDER — TETANUS-DIPHTH-ACELL PERTUSSIS 5-2.5-18.5 LF-MCG/0.5 IM SUSP: 0.5000 mL | Freq: Once | INTRAMUSCULAR | Status: DC

## 2018-11-27 MED ORDER — SENNOSIDES-DOCUSATE SODIUM 8.6-50 MG PO TABS
2.0000 | ORAL_TABLET | ORAL | Status: DC
  Administered 2018-11-27: 2 via ORAL
  Filled 2018-11-27: qty 2

## 2018-11-27 MED ORDER — WITCH HAZEL-GLYCERIN EX PADS: 1.0000 "application " | MEDICATED_PAD | CUTANEOUS | Status: DC | PRN

## 2018-11-27 MED ORDER — ONDANSETRON HCL 4 MG/2ML IJ SOLN: 4.0000 mg | INTRAMUSCULAR | Status: DC | PRN

## 2018-11-27 MED ORDER — ACETAMINOPHEN 325 MG PO TABS
650.0000 mg | ORAL_TABLET | ORAL | Status: DC | PRN
  Administered 2018-11-27 (×2): 650 mg via ORAL
  Filled 2018-11-27 (×2): qty 2

## 2018-11-27 MED ORDER — ZOLPIDEM TARTRATE 5 MG PO TABS: 5.0000 mg | ORAL_TABLET | Freq: Every evening | ORAL | Status: DC | PRN

## 2018-11-27 NOTE — Anesthesia Postprocedure Evaluation (Signed)
Anesthesia Post Note  Patient: Veta Dambrosia  Procedure(s) Performed: AN AD [place]     Patient location during evaluation: Mother Baby Anesthesia Type: Epidural Level of consciousness: awake and alert and oriented Pain management: satisfactory to patient Vital Signs Assessment: post-procedure vital signs reviewed and stable Respiratory status: respiratory function stable Cardiovascular status: stable Postop Assessment: no headache, no backache, epidural receding, patient able to bend at knees, no signs of nausea or vomiting and adequate PO intake Anesthetic complications: no    Last Vitals:  Vitals:   11/27/18 0431 11/27/18 0844  BP: (!) 145/90 (!) 145/88  Pulse: (!) 58 60  Resp: 15 18  Temp: 36.9 C 37.1 C  SpO2: 100% 100%    Last Pain:  Vitals:   11/27/18 0844  TempSrc: Oral  PainSc: 5    Pain Goal: Patients Stated Pain Goal: 5 (11/26/18 2136)                 Katherina Mires

## 2018-11-27 NOTE — Discharge Summary (Signed)
Postpartum Discharge Summary     Patient Name: Kathleen Dixon DOB: [birth year] MRN: [id number]  Date of admission: 11/26/2018 Delivering Provider: Sharyon CableOGERS, VERONICA C   Date of discharge: 11/28/2018  Admitting diagnosis: pregnancy Intrauterine pregnancy: 169w1d     Secondary diagnosis:  Principal Problem:   Post-dates pregnancy Active Problems:   SVD (spontaneous vaginal delivery)  Additional problems: Gestational hypertension, AMA     Discharge diagnosis: Term Pregnancy Delivered, Gestational Hypertension and AMA                                                                                                Post partum procedures:none  Augmentation: AROM, Pitocin and Cytotec  Complications: None  Hospital course:  Induction of Labor With Vaginal Delivery   38 y.o. yo G2P1001 at 4369w1d was admitted to the hospital 11/26/2018 for induction of labor.  Indication for induction: Postdates.  Patient had an uncomplicated labor course as follows: Membrane Rupture Time/Date: 6:59 PM ,11/26/2018   Intrapartum Procedures: Episiotomy: None [1]                                         Lacerations:  1st degree [2]  Patient had delivery of a Viable infant.  Information for the patient's newborn:  Lynelle Smokenderson, Boy Danisa [[id number]][[id number]]  Delivery Method: Vag-Spont(filed from delivery)    11/27/2018  Details of delivery can be found in separate delivery note.  Patient had a routine postpartum course. Patient is discharged home 11/28/18.  Magnesium Sulfate recieved: No BMZ received: No  Physical exam  Vitals:   11/27/18 1840 11/27/18 2125 11/28/18 0557 11/28/18 1138  BP: 122/79 139/82 (!) 144/95 (!) 132/98  Pulse: (!) 56 86 64 68  Resp:  18 18 18   Temp:  98.3 F (36.8 C) 98.2 F (36.8 C) 99 F (37.2 C)  TempSrc:  Oral Oral Oral  SpO2:  100% 100%   Weight:      Height:       General: alert, cooperative and no distress Lochia: appropriate Uterine Fundus: firm Incision: N/A DVT  Evaluation: No evidence of DVT seen on physical exam. Negative Homan's sign. No cords or calf tenderness. Labs: Lab Results  Component Value Date   WBC 12.8 (H) 11/26/2018   HGB 10.9 (L) 11/26/2018   HCT 33.4 (L) 11/26/2018   MCV 96.3 11/26/2018   PLT 252 11/26/2018   CMP Latest Ref Rng & Units 11/26/2018  Glucose 70 - 99 mg/dL 81  BUN 6 - 20 mg/dL 12  Creatinine 7.820.44 - 9.561.00 mg/dL 2.130.89  Sodium 086135 - 578145 mmol/L 136  Potassium 3.5 - 5.1 mmol/L 3.8  Chloride 98 - 111 mmol/L 108  CO2 22 - 32 mmol/L 22  Calcium 8.9 - 10.3 mg/dL 4.6(N8.6(L)  Total Protein 6.5 - 8.1 g/dL 6.3(L)  Total Bilirubin 0.3 - 1.2 mg/dL 0.5  Alkaline Phos 38 - 126 U/L 125  AST 15 - 41 U/L 15  ALT 0 - 44 U/L 11    Discharge instruction: per  After Visit Summary and "Baby and Me Booklet".  After visit meds:  Allergies as of 11/28/2018   No Known Allergies     Medication List    STOP taking these medications   [place] these medications   amLODipine 5 MG tablet Commonly known as: NORVASC Take 1 tablet (5 mg total) by mouth daily.   ferrous sulfate 325 (65 FE) MG tablet Take 1 tablet (325 mg total) by mouth 2 (two) times daily with a meal.   ibuprofen 600 MG tablet Commonly known as: ADVIL Take 1 tablet (600 mg total) by mouth every 6 (six) hours.   Prenate Mini 29-0.6-0.4-350 MG Caps Take 1 capsule by mouth daily before breakfast.       Diet: routine diet  Activity: Advance as tolerated. Pelvic rest for 6 weeks.   Outpatient follow up:1 week for BP check and BTL pre-op appointment Follow up Appt: Future Appointments  Date Time Provider [place]  12/05/2018  4:00 PM Constant, Vickii Chafe, MD [place] None  12/29/2018  9:45 AM Constant, Vickii Chafe, MD [place] None   Follow up Visit: [place]. Schedule an appointment as soon as possible for a visit in 1 week(s).   Specialty: Obstetrics and  Gynecology Contact information: [address] [place] [phone]           Please schedule this patient for Postpartum visit in: 4 weeks with the following provider: MD For C/S patients schedule nurse incision check in weeks 2 weeks: no High risk pregnancy complicated by: HTN Delivery mode:  SVD Anticipated Birth Control:  Plans Interval BTL PP Procedures needed: BP check and preop appointment in 1 week   Schedule Integrated [place] visit: no   Newborn Data: Live born female  Birth Weight: 8 lb 0.2 oz (3635 g) APGAR: 7, 9  Newborn Delivery   Birth date/time: 11/27/2018 01:08:00 Delivery type: Vaginal, Spontaneous      Baby Feeding: Bottle and Breast Disposition:home with mother   11/28/2018 Truett Mainland, DO

## 2018-11-28 MED ORDER — AMLODIPINE BESYLATE 5 MG PO TABS: 5.0000 mg | ORAL_TABLET | Freq: Every day | ORAL | 1 refills | Status: DC

## 2018-11-28 MED ORDER — IBUPROFEN 600 MG PO TABS: 600.0000 mg | ORAL_TABLET | Freq: Four times a day (QID) | ORAL | 0 refills | Status: AC

## 2018-11-28 NOTE — Discharge Instructions (Signed)
Vaginal Delivery, Care After °Refer to this sheet in the next few weeks. These instructions provide you with information about caring for yourself after vaginal delivery. Your health care provider may also give you more specific instructions. Your treatment has been planned according to current medical practices, but problems sometimes occur. Call your health care provider if you have any problems or questions. °What can I expect after the procedure? °After vaginal delivery, it is common to have: °· Some bleeding from your vagina. °· Soreness in your abdomen, your vagina, and the area of skin between your vaginal opening and your anus (perineum). °· Pelvic cramps. °· Fatigue. °Follow these instructions at home: °Medicines °· Take over-the-counter and prescription medicines only as told by your health care provider. °· If you were prescribed an antibiotic medicine, take it as told by your health care provider. Do not stop taking the antibiotic until it is finished. °Driving ° °· Do not drive or operate heavy machinery while taking prescription pain medicine. °· Do not drive for 24 hours if you received a sedative. °Lifestyle °· Do not drink alcohol. This is especially important if you are breastfeeding or taking medicine to relieve pain. °· Do not use tobacco products, including cigarettes, chewing tobacco, or e-cigarettes. If you need help quitting, ask your health care provider. °Eating and drinking °· Drink at least 8 eight-ounce glasses of water every day unless you are told not to by your health care provider. If you choose to breastfeed your baby, you may need to drink more water than this. °· Eat high-fiber foods every day. These foods may help prevent or relieve constipation. High-fiber foods include: °? Whole grain cereals and breads. °? Brown rice. °? Beans. °? Fresh fruits and vegetables. °Activity °· Return to your normal activities as told by your health care provider. Ask your health care provider what  activities are safe for you. °· Rest as much as possible. Try to rest or take a nap when your baby is sleeping. °· Do not lift anything that is heavier than your baby or 10 lb (4.5 kg) until your health care provider says that it is safe. °· Talk with your health care provider about when you can engage in sexual activity. This may depend on your: °? Risk of infection. °? Rate of healing. °? Comfort and desire to engage in sexual activity. °Vaginal Care °· If you have an episiotomy or a vaginal tear, check the area every day for signs of infection. Check for: °? More redness, swelling, or pain. °? More fluid or blood. °? Warmth. °? Pus or a bad smell. °· Do not use tampons or douches until your health care provider says this is safe. °· Watch for any blood clots that may pass from your vagina. These may look like clumps of dark red, brown, or black discharge. °General instructions °· Keep your perineum clean and dry as told by your health care provider. °· Wear loose, comfortable clothing. °· Wipe from front to back when you use the toilet. °· Ask your health care provider if you can shower or take a bath. If you had an episiotomy or a perineal tear during labor and delivery, your health care provider may tell you not to take baths for a certain length of time. °· Wear a bra that supports your breasts and fits you well. °· If possible, have someone help you with household activities and help care for your baby for at least a few days after you   leave the hospital. °· Keep all follow-up visits for you and your baby as told by your health care provider. This is important. °Contact a health care provider if: °· You have: °? Vaginal discharge that has a bad smell. °? Difficulty urinating. °? Pain when urinating. °? A sudden increase or decrease in the frequency of your bowel movements. °? More redness, swelling, or pain around your episiotomy or vaginal tear. °? More fluid or blood coming from your episiotomy or vaginal  tear. °? Pus or a bad smell coming from your episiotomy or vaginal tear. °? A fever. °? A rash. °? Little or no interest in activities you used to enjoy. °? Questions about caring for yourself or your baby. °· Your episiotomy or vaginal tear feels warm to the touch. °· Your episiotomy or vaginal tear is separating or does not appear to be healing. °· Your breasts are painful, hard, or turn red. °· You feel unusually sad or worried. °· You feel nauseous or you vomit. °· You pass large blood clots from your vagina. If you pass a blood clot from your vagina, save it to show to your health care provider. Do not flush blood clots down the toilet without having your health care provider look at them. °· You urinate more than usual. °· You are dizzy or light-headed. °· You have not breastfed at all and you have not had a menstrual period for 12 weeks after delivery. °· You have stopped breastfeeding and you have not had a menstrual period for 12 weeks after you stopped breastfeeding. °Get help right away if: °· You have: °? Pain that does not go away or does not get better with medicine. °? Chest pain. °? Difficulty breathing. °? Blurred vision or spots in your vision. °? Thoughts about hurting yourself or your baby. °· You develop pain in your abdomen or in one of your legs. °· You develop a severe headache. °· You faint. °· You bleed from your vagina so much that you fill two sanitary pads in one hour. °This information is not intended to replace advice given to you by your health care provider. Make sure you discuss any questions you have with your health care provider. °Document Released: 05/28/2000 Document Revised: 11/12/2015 Document Reviewed: 06/15/2015 °Elsevier Interactive Patient Education © 2019 [employer] ° °

## 2018-12-05 ENCOUNTER — Ambulatory Visit: Payer: Medicaid Other | Admitting: Obstetrics and Gynecology

## 2018-12-29 ENCOUNTER — Ambulatory Visit: Payer: Medicaid Other | Admitting: Obstetrics and Gynecology

## 2019-01-08 ENCOUNTER — Ambulatory Visit: Payer: Medicaid Other | Admitting: Family Medicine

## 2019-01-15 ENCOUNTER — Telehealth: Payer: Self-pay | Admitting: Family Medicine

## 2019-04-26 IMAGING — US US MFM OB DETAIL+14 WK
1 series · 14 of 28 positions shown · non-contrast
Comparison: none

[Series 1: us mfm ob detail+14 wk · 14 of 119 slices shown]
[im 5/119]
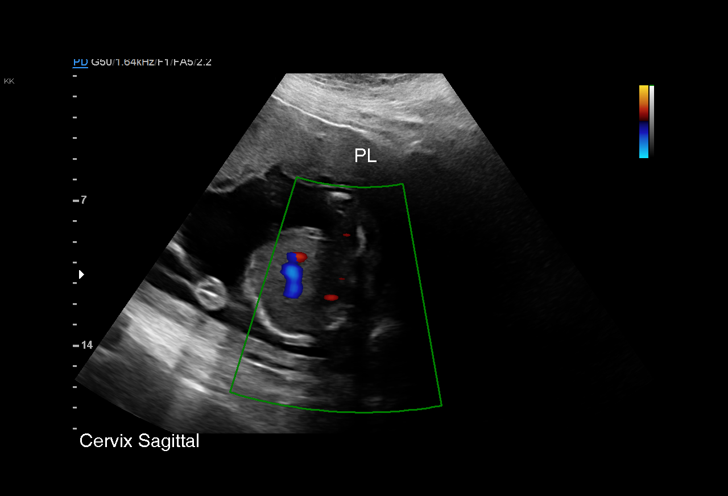
[im 14/119]
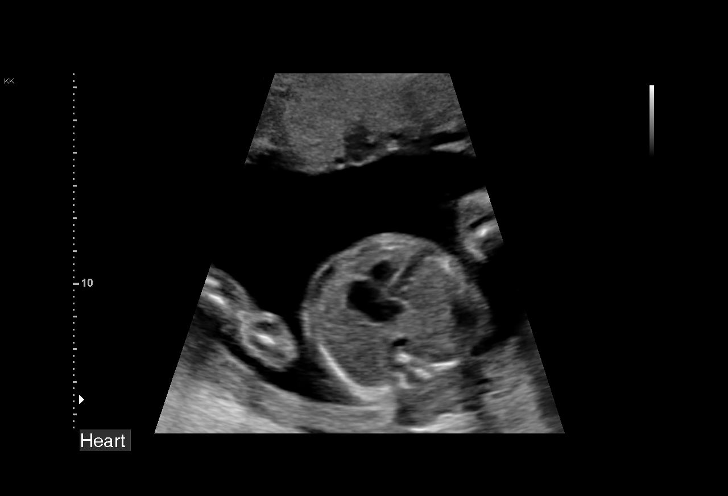
[im 22/119]
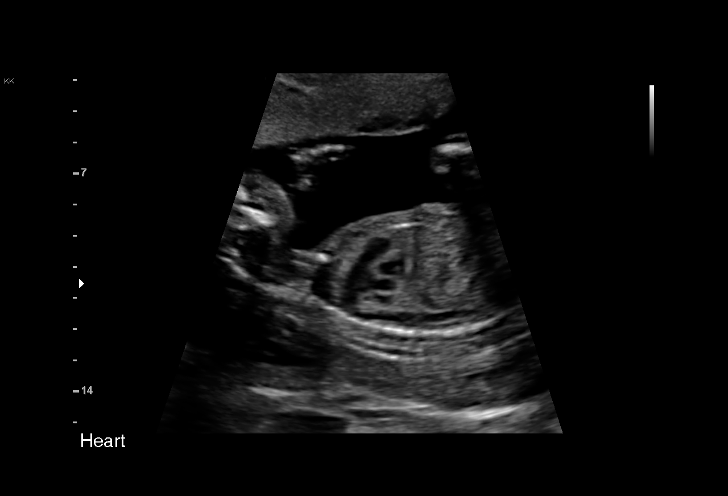
[im 31/119]
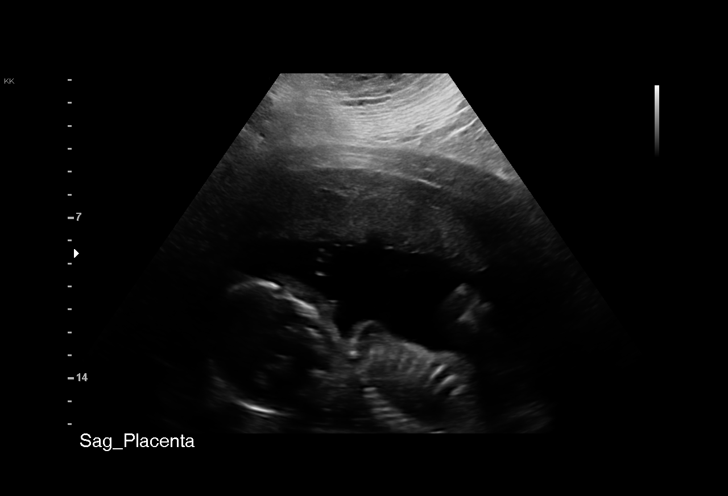
[im 40/119]
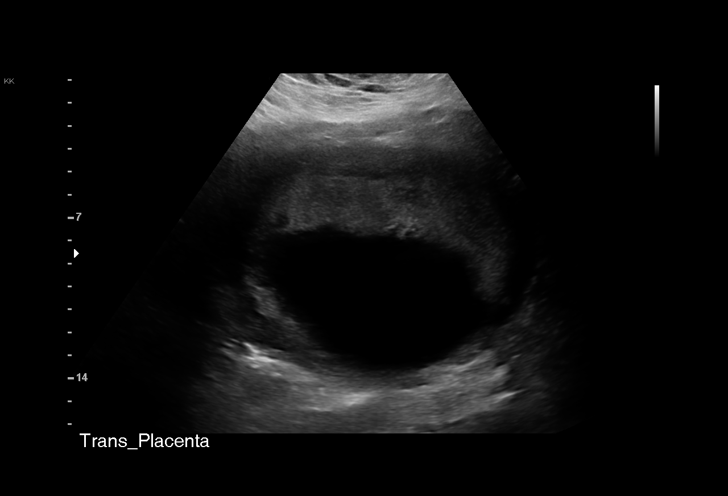
[im 49/119]
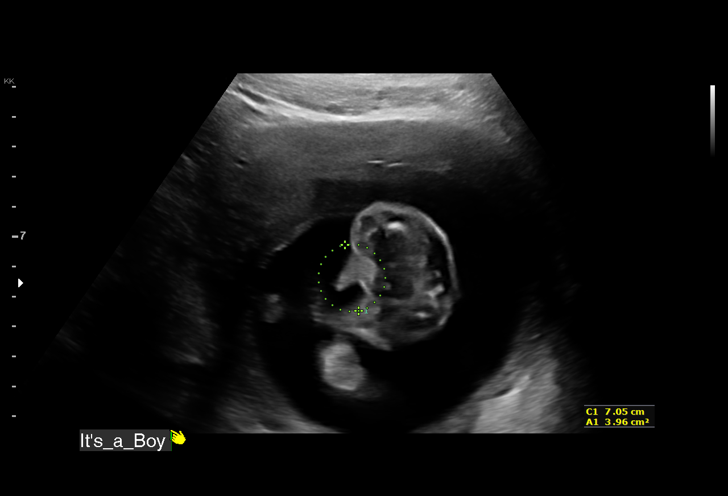
[im 57/119]
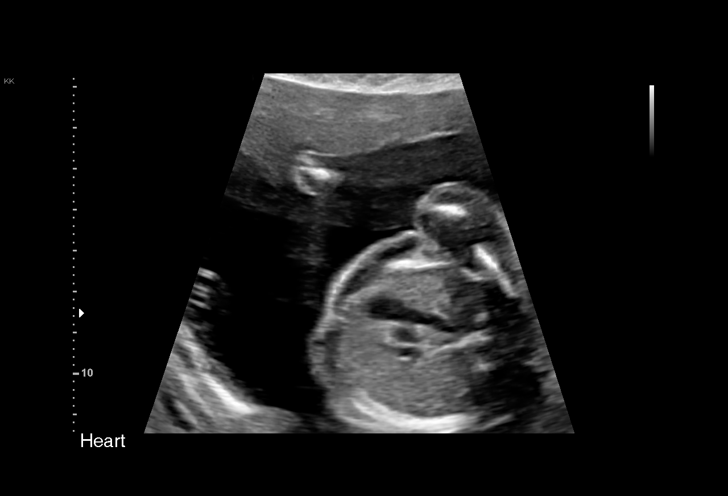
[im 66/119]
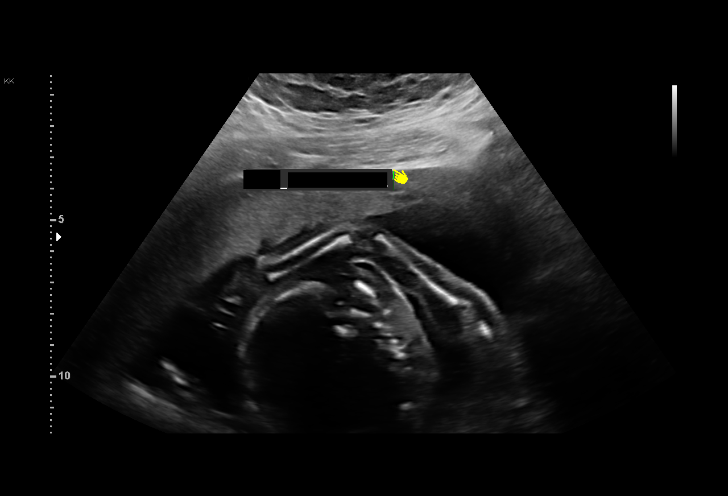
[im 75/119]
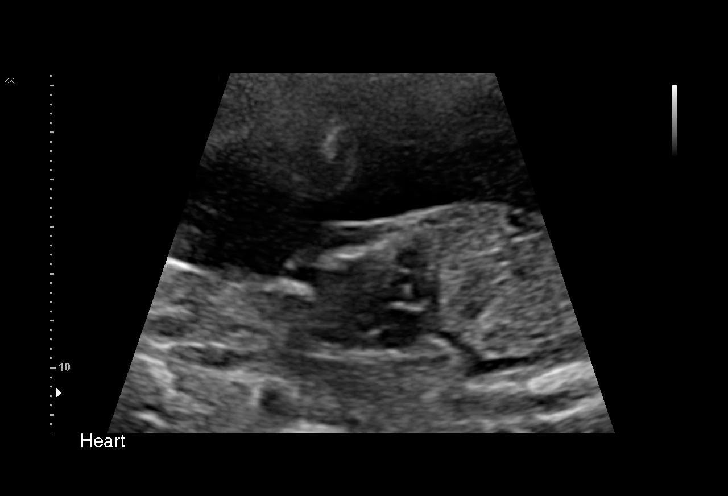
[im 84/119]
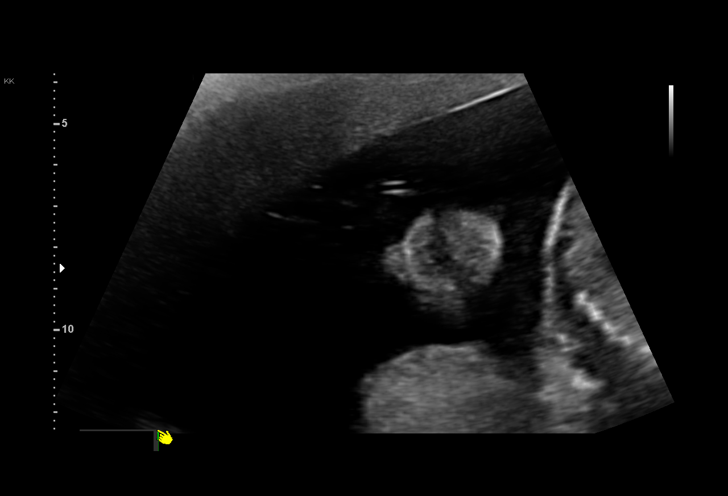
[im 92/119]
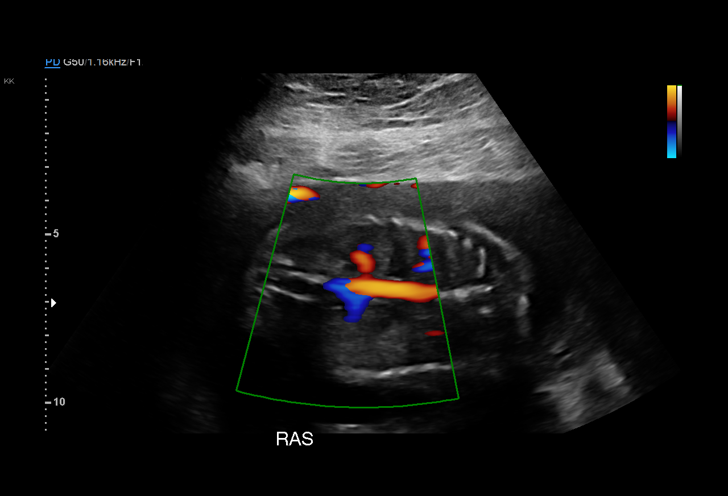
[im 101/119]
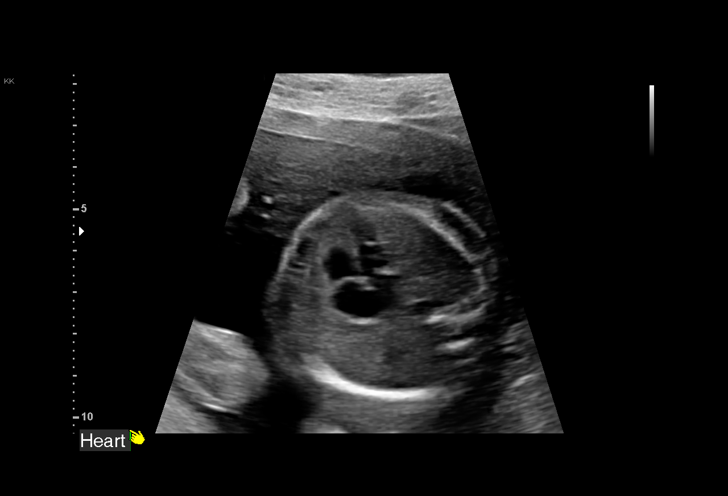
[im 110/119]
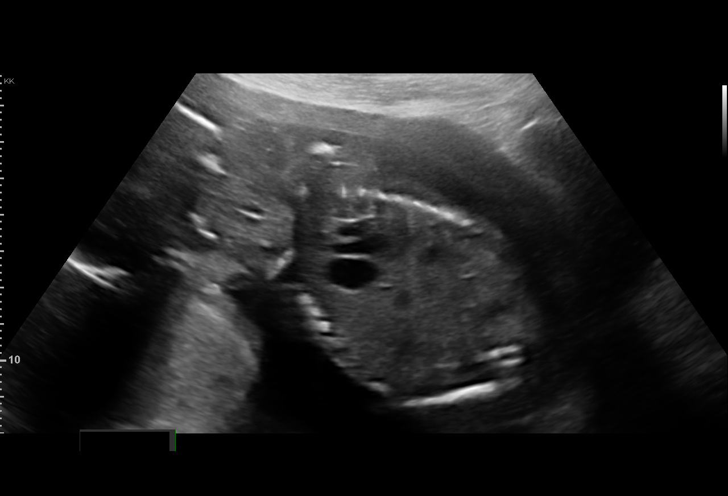
[im 119/119]
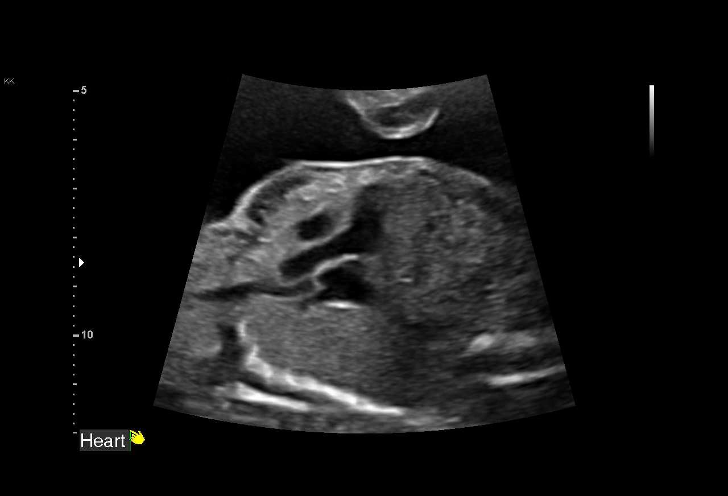

[14 of 28 positions shown; findings below may reference images not displayed]

----------------------------------------------------------------------

 ----------------------------------------------------------------------
Indications

  Encounter for antenatal screening for
  malformations
  Advanced maternal age multigravida 35+,
  second trimester
  Obesity complicating pregnancy, second
  trimester
  20 weeks gestation of pregnancy
 ----------------------------------------------------------------------
Fetal Evaluation

 Num Of Fetuses:          1
 Fetal Heart Rate(bpm):   154
 Cardiac Activity:        Observed
 Presentation:            Breech
 Placenta:                Anterior
 P. Cord Insertion:       Visualized

 Amniotic Fluid
 AFI FV:      Within normal limits

                             Largest Pocket(cm)

Biometry

 BPD:      50.1  mm     G. Age:  21w 1d         86  %    CI:        69.21   %    70 - 86
                                                         FL/HC:       18.1  %    16.8 -
 HC:      192.3  mm     G. Age:  21w 4d         91  %    HC/AC:       1.02       1.09 -
 AC:      188.3  mm     G. Age:  23w 4d       > 97  %    FL/BPD:      69.7  %
 FL:       34.9  mm     G. Age:  21w 0d         72  %    FL/AC:       18.5  %    20 - 24
 HUM:      35.2  mm     G. Age:  22w 1d       > 95  %
 NFT:         5  mm
 Est. FW:     492   gm     1 lb 1 oz   > 75  %
OB History

 Gravidity:    2         Term:   1
 Living:       1
Gestational Age

 LMP:           20w 1d        Date:  02/12/18                 EDD:   11/19/18
 U/S Today:     21w 6d                                        EDD:   11/07/18
 Best:          20w 1d     Det. By:  LMP  (02/12/18)          EDD:   11/19/18
Anatomy

 Cranium:               Appears normal         Aortic Arch:            Not well visualized
 Cavum:                 Appears normal         Ductal Arch:            Appears normal
 Ventricles:            Appears normal         Diaphragm:              Appears normal
 Choroid Plexus:        Appears normal         Stomach:                Appears normal, left
                                                                       sided
 Cerebellum:            Appears normal         Abdomen:                Appears normal
 Posterior Fossa:       Appears normal         Abdominal Wall:         Appears nml (cord
                                                                       insert, abd wall)
 Nuchal Fold:           Appears normal         Cord Vessels:           Appears normal (3
                                                                       vessel cord)
 Face:                  Appears normal         Kidneys:                Appear normal
                        (orbits and profile)
 Lips:                  Not well visualized    Bladder:                Appears normal
 Thoracic:              Appears normal         Spine:                  Not well visualized
 Heart:                 Appears normal         Upper Extremities:      Appears normal
                        (4CH, axis, and situs
 RVOT:                  Appears normal         Lower Extremities:      Appears normal
 LVOT:                  Appears normal

 Other:  Male gender. Technically difficult due to maternal habitus and fetal
         position.
Cervix Uterus Adnexa

 Cervix
 Length:              4  cm.
 Normal appearance by transabdominal scan.
Impression

 Normal interval growth.  No ultrasonic evidence of structural
 fetal anomalies.
 Suboptimal views of the fetal anatomy were obtained.
Recommendations

 Follow up anatomy in 4 weeks.

## 2019-06-29 ENCOUNTER — Other Ambulatory Visit: Payer: Self-pay | Admitting: Family Medicine

## 2020-02-26 ENCOUNTER — Ambulatory Visit: Payer: Medicaid Other | Admitting: Obstetrics

## 2020-06-24 ENCOUNTER — Ambulatory Visit: Payer: Medicaid Other | Admitting: Obstetrics

## 2020-07-22 ENCOUNTER — Ambulatory Visit: Payer: Medicaid Other | Admitting: Obstetrics

## 2020-08-07 ENCOUNTER — Ambulatory Visit: Payer: Medicaid Other | Admitting: Obstetrics

## 2021-02-19 ENCOUNTER — Ambulatory Visit: Payer: Medicaid Other | Admitting: Obstetrics

## 2021-07-09 ENCOUNTER — Ambulatory Visit: Payer: Medicaid Other | Admitting: Obstetrics

## 2021-08-19 ENCOUNTER — Other Ambulatory Visit (HOSPITAL_COMMUNITY)
Admission: RE | Admit: 2021-08-19 | Discharge: 2021-08-19 | Disposition: A | Discharge status: Home / Self Care | Payer: Medicaid Other | Source: Ambulatory Visit | Attending: Obstetrics | Admitting: Obstetrics

## 2021-08-19 ENCOUNTER — Encounter: Payer: Self-pay | Admitting: Obstetrics

## 2021-08-19 ENCOUNTER — Ambulatory Visit (INDEPENDENT_AMBULATORY_CARE_PROVIDER_SITE_OTHER): Payer: Medicaid Other | Admitting: Obstetrics

## 2021-08-19 ENCOUNTER — Other Ambulatory Visit: Payer: Self-pay

## 2021-08-19 VITALS — BP 132/98 | HR 86 | Ht 70.0 in | Wt 305.0 lb

## 2021-08-19 DIAGNOSIS — N898 Other specified noninflammatory disorders of vagina: Secondary | ICD-10-CM

## 2021-08-19 DIAGNOSIS — Z01419 Encounter for gynecological examination (general) (routine) without abnormal findings: Secondary | ICD-10-CM | POA: Diagnosis present

## 2021-08-19 DIAGNOSIS — N946 Dysmenorrhea, unspecified: Secondary | ICD-10-CM

## 2021-08-19 DIAGNOSIS — Z113 Encounter for screening for infections with a predominantly sexual mode of transmission: Secondary | ICD-10-CM

## 2021-08-19 LAB — POCT URINE PREGNANCY: Preg Test, Ur: POSITIVE — AB

## 2021-08-19 MED ORDER — IBUPROFEN 800 MG PO TABS: 800.0000 mg | ORAL_TABLET | Freq: Three times a day (TID) | ORAL | 5 refills | Status: DC | PRN

## 2021-08-19 MED ORDER — FLUCONAZOLE 150 MG PO TABS: 150.0000 mg | ORAL_TABLET | Freq: Once | ORAL | 0 refills | Status: AC

## 2021-08-19 MED ORDER — METRONIDAZOLE 500 MG PO TABS: 500.0000 mg | ORAL_TABLET | Freq: Two times a day (BID) | ORAL | 2 refills | Status: DC

## 2021-08-19 NOTE — Progress Notes (Signed)
? ?Subjective: ? ? ?  ?  ? Kathleen Dixon is a 41 y.o. female here for a routine exam.  Current complaints: Vaginal discharge.   ? ?Personal health questionnaire:  ?Is patient [name], have a family history of breast and/or ovarian cancer: no ?Is there a family history of uterine cancer diagnosed at age < 24, gastrointestinal cancer, urinary tract cancer, family member who is a Personnel officer syndrome-associated carrier: no ?Is the patient overweight and hypertensive, family history of diabetes, personal history of gestational diabetes, preeclampsia or PCOS: no ?Is patient over 76, have PCOS,  family history of premature CHD under age 33, diabetes, smoke, have hypertension or peripheral artery disease:  no ?At any time, has a partner hit, kicked or otherwise hurt or frightened you?: no ?Over the past 2 weeks, have you felt down, depressed or hopeless?: no ?Over the past 2 weeks, have you felt little interest or pleasure in doing things?:no ? ? ?Gynecologic History ?Patient's last menstrual period was 07/12/2021. ?Contraception: none ?Last Pap: 2021. Results were: abnormal ?Last mammogram: n/a. Results were: n/a ? ?Obstetric History ?OB History  ?Gravida Para Term Preterm AB Living  ?2 2 2  0 0 2  ?SAB IAB Ectopic Multiple Live Births  ?0 0 0 0 2  ?  ?# Outcome Date GA Lbr Len/2nd Weight Sex Delivery Anes PTL Lv  ?2 Term 11/27/18 [redacted]w[redacted]d 00:19 / 00:04 8 lb 0.2 oz (3.635 kg) M Vag-Spont EPI  LIV  ?1 Term 01/22/09 [redacted]w[redacted]d  6 lb 9 oz (2.977 kg) F Vag-Spont EPI  LIV  ? ? ?Past Medical History:  ?Diagnosis Date  ? Diabetes (HCC) 2004  ?  ?Past Surgical History:  ?Procedure Laterality Date  ? ANKLE SURGERY    ? HIP SURGERY    ? LEG SURGERY    ?  ? ?Current Outpatient Medications:  ?  fluconazole (DIFLUCAN) 150 MG tablet, Take 1 tablet (150 mg total) by mouth once for 1 dose., Disp: 1 tablet, Rfl: 0 ?  ibuprofen (ADVIL) 800 MG tablet, Take 1 tablet (800 mg total) by mouth every 8 (eight) hours as needed., Disp: 30 tablet,  Rfl: 5 ?  metroNIDAZOLE (FLAGYL) 500 MG tablet, Take 1 tablet (500 mg total) by mouth 2 (two) times daily., Disp: 14 tablet, Rfl: 2 ?  ibuprofen (ADVIL) 600 MG tablet, Take 1 tablet (600 mg total) by mouth every 6 (six) hours., Disp: 30 tablet, Rfl: 0 ?No Known Allergies  ?Social History  ? ?Tobacco Use  ? Smoking status: Some Days  ? Smokeless tobacco: Never  ? Tobacco comments:  ?  Every few months  ?Substance Use Topics  ? Alcohol use: Not Currently  ?  Comment: not since confirmed pregnancy  ?  ?Family History  ?Problem Relation Age of Onset  ? Diabetes Mother   ? Hypertension Mother   ? Diabetes Father   ? Hypertension Father   ?  ? ? ?Review of Systems ? ?Constitutional: negative for fatigue and weight loss ?Respiratory: negative for cough and wheezing ?Cardiovascular: negative for chest pain, fatigue and palpitations ?Gastrointestinal: negative for abdominal pain and change in bowel habits ?Musculoskeletal:negative for myalgias ?Neurological: negative for gait problems and tremors ?Behavioral/Psych: negative for abusive relationship, depression ?Endocrine: negative for temperature intolerance    ?Genitourinary:negative for abnormal menstrual periods, genital lesions, hot flashes, sexual problems and vaginal discharge ?Integument/breast: negative for breast lump, breast tenderness, nipple discharge and skin lesion(s) ? ?  ?Objective:  ? ?    ?BP (!) 132/98  Pulse 86   Ht 5\' 10"  (1.778 m)   Wt (!) 305 lb (138.3 kg)   LMP 07/12/2021   BMI 43.76 kg/m?  ?General:   alert  ?Skin:   no rash or abnormalities  ?Lungs:   clear to auscultation bilaterally  ?Heart:   regular rate and rhythm, S1, S2 normal, no murmur, click, rub or gallop  ?Breasts:   normal without suspicious masses, skin or nipple changes or axillary nodes  ?Abdomen:  normal findings: no organomegaly, soft, non-tender and no hernia  ?Pelvis:  External genitalia: normal general appearance ?Urinary system: urethral meatus normal and bladder without  fullness, nontender ?Vaginal: normal without tenderness, induration or masses ?Cervix: normal appearance ?Adnexa: normal bimanual exam ?Uterus: anteverted and non-tender, normal size  ? ?Lab Review ?Urine pregnancy test ?Labs reviewed yes ?Radiologic studies reviewed no ? ?I have spent a total of 20 minutes of face-to-face time, excluding clinical staff time, reviewing notes and preparing to see patient, ordering tests and/or medications, and counseling the patient.  ? ? ?Assessment:  ? ?1. Encounter for routine gynecological examination with Papanicolaou smear of cervix ?Rx: ?- Cytology - PAP( [place]) ?- POCT urine pregnancy ? ?2. Vaginal discharge ?Rx: ?- Cervicovaginal ancillary only( [place]) ?- fluconazole (DIFLUCAN) 150 MG tablet; Take 1 tablet (150 mg total) by mouth once for 1 dose.  Dispense: 1 tablet; Refill: 0 ?- metroNIDAZOLE (FLAGYL) 500 MG tablet; Take 1 tablet (500 mg total) by mouth 2 (two) times daily.  Dispense: 14 tablet; Refill: 2 ? ?3. Screening for STD (sexually transmitted disease) ?Rx: ?- HIV Antibody (routine testing w rflx) ?- Hepatitis B surface antigen ?- RPR ?- Hepatitis C antibody  ?  ? ?Plan:  ? ? Education reviewed: calcium supplements, depression evaluation, low fat, low cholesterol diet, safe sex/STD prevention, self breast exams, and weight bearing exercise. ?Contraception: none. ?Follow up in: 1 year.  ? ?Meds ordered this encounter  ?Medications  ? fluconazole (DIFLUCAN) 150 MG tablet  ?  Sig: Take 1 tablet (150 mg total) by mouth once for 1 dose.  ?  Dispense:  1 tablet  ?  Refill:  0  ? metroNIDAZOLE (FLAGYL) 500 MG tablet  ?  Sig: Take 1 tablet (500 mg total) by mouth 2 (two) times daily.  ?  Dispense:  14 tablet  ?  Refill:  2  ? ibuprofen (ADVIL) 800 MG tablet  ?  Sig: Take 1 tablet (800 mg total) by mouth every 8 (eight) hours as needed.  ?  Dispense:  30 tablet  ?  Refill:  5  ? ?Orders Placed This Encounter  ?Procedures  ? HIV Antibody (routine testing w rflx)  ?  Hepatitis B surface antigen  ? RPR  ? Hepatitis C antibody  ? POCT urine pregnancy  ? ? ? ? 07/14/2021, MD ?08/19/2021 9:31 AM  ?

## 2021-08-20 LAB — CERVICOVAGINAL ANCILLARY ONLY
Bacterial Vaginitis (gardnerella): POSITIVE — AB
Candida Glabrata: NEGATIVE
Candida Vaginitis: NEGATIVE
Chlamydia: NEGATIVE
Comment: NEGATIVE
Comment: NEGATIVE
Comment: NEGATIVE
Comment: NEGATIVE
Comment: NEGATIVE
Comment: NORMAL
Neisseria Gonorrhea: NEGATIVE
Trichomonas: NEGATIVE

## 2021-08-20 LAB — HEPATITIS C ANTIBODY: Hep C Virus Ab: NONREACTIVE

## 2021-08-20 LAB — HIV ANTIBODY (ROUTINE TESTING W REFLEX): HIV Screen 4th Generation wRfx: NONREACTIVE

## 2021-08-20 LAB — RPR: RPR Ser Ql: NONREACTIVE

## 2021-08-20 LAB — HEPATITIS B SURFACE ANTIGEN: Hepatitis B Surface Ag: NEGATIVE

## 2021-08-21 LAB — CYTOLOGY - PAP
Comment: NEGATIVE
Diagnosis: NEGATIVE
High risk HPV: NEGATIVE

## 2021-08-25 ENCOUNTER — Other Ambulatory Visit: Payer: Self-pay | Admitting: Obstetrics

## 2021-09-07 ENCOUNTER — Encounter: Payer: Self-pay | Admitting: Obstetrics

## 2023-09-02 ENCOUNTER — Other Ambulatory Visit (HOSPITAL_COMMUNITY)
Admission: RE | Admit: 2023-09-02 | Discharge: 2023-09-02 | Disposition: A | Discharge status: Home / Self Care | Source: Ambulatory Visit | Attending: Obstetrics | Admitting: Obstetrics

## 2023-09-02 ENCOUNTER — Ambulatory Visit: Payer: Medicaid Other | Admitting: Obstetrics

## 2023-09-02 ENCOUNTER — Encounter: Payer: Self-pay | Admitting: Obstetrics

## 2023-09-02 VITALS — BP 163/99 | HR 66 | Ht 70.0 in | Wt 307.7 lb

## 2023-09-02 DIAGNOSIS — Z0000 Encounter for general adult medical examination without abnormal findings: Secondary | ICD-10-CM

## 2023-09-02 DIAGNOSIS — N898 Other specified noninflammatory disorders of vagina: Secondary | ICD-10-CM | POA: Diagnosis not present

## 2023-09-02 DIAGNOSIS — Z1239 Encounter for other screening for malignant neoplasm of breast: Secondary | ICD-10-CM

## 2023-09-02 DIAGNOSIS — N946 Dysmenorrhea, unspecified: Secondary | ICD-10-CM | POA: Diagnosis not present

## 2023-09-02 DIAGNOSIS — Z131 Encounter for screening for diabetes mellitus: Secondary | ICD-10-CM

## 2023-09-02 DIAGNOSIS — Z113 Encounter for screening for infections with a predominantly sexual mode of transmission: Secondary | ICD-10-CM

## 2023-09-02 DIAGNOSIS — N939 Abnormal uterine and vaginal bleeding, unspecified: Secondary | ICD-10-CM | POA: Diagnosis not present

## 2023-09-02 DIAGNOSIS — E66813 Obesity, class 3: Secondary | ICD-10-CM

## 2023-09-02 DIAGNOSIS — Z6841 Body Mass Index (BMI) 40.0 and over, adult: Secondary | ICD-10-CM

## 2023-09-02 MED ORDER — IBUPROFEN 800 MG PO TABS: 800.0000 mg | ORAL_TABLET | Freq: Three times a day (TID) | ORAL | 5 refills | Status: DC | PRN

## 2023-09-02 MED ORDER — FLUCONAZOLE 200 MG PO TABS: 200.0000 mg | ORAL_TABLET | ORAL | 2 refills | Status: DC

## 2023-09-02 MED ORDER — METRONIDAZOLE 500 MG PO TABS: 500.0000 mg | ORAL_TABLET | Freq: Two times a day (BID) | ORAL | 2 refills | Status: DC

## 2023-09-02 NOTE — Progress Notes (Signed)
 Pt presents for annual. pt is having heavy periods with clots every month. Pt is concerned that she may have fibroids.

## 2023-09-02 NOTE — Progress Notes (Signed)
 Patient ID: Kathleen Dixon, female   DOB: 29-Aug-1980, 43 y.o.   MRN: [id number]  Chief Complaint  Patient presents with   Annual Exam    HPI Kathleen Dixon is a 43 y.o. female.  Patient declines Annual/Pap.  Complains of irregular, heavy and painful 7 day periods.   Contraception:  none.  She states that hormonal contraception makes her feel unpleasantly different.  Wants a resolution to this bleeding, but does not want a hysterectomy, and does not want to take hormonal birth control.  She does not desire future fertility. HPI  Past Medical History:  Diagnosis Date   Diabetes (HCC) 2004    Past Surgical History:  Procedure Laterality Date   ANKLE SURGERY     HIP SURGERY     LEG SURGERY      Family History  Problem Relation Age of Onset   Diabetes Mother    Hypertension Mother    Diabetes Father    Hypertension Father     Social History Social History   Tobacco Use   Smoking status: Some Days   Smokeless tobacco: Never   Tobacco comments:    Every few months  Vaping Use   Vaping status: Never Used  Substance Use Topics   Alcohol use: Not Currently    Comment: not since confirmed pregnancy   Drug use: No    No Known Allergies  Current Outpatient Medications  Medication Sig Dispense Refill   fluconazole (DIFLUCAN) 200 MG tablet Take 1 tablet (200 mg total) by mouth every 3 (three) days. 3 tablet 2   ibuprofen (ADVIL) 600 MG tablet Take 1 tablet (600 mg total) by mouth every 6 (six) hours. 30 tablet 0   ibuprofen (ADVIL) 800 MG tablet Take 1 tablet (800 mg total) by mouth every 8 (eight) hours as needed. 30 tablet 5   metroNIDAZOLE (FLAGYL) 500 MG tablet Take 1 tablet (500 mg total) by mouth 2 (two) times daily. 14 tablet 2   No current facility-administered medications for this visit.    Review of Systems Review of Systems Constitutional: negative for fatigue and weight loss Respiratory: negative for cough and wheezing Cardiovascular: negative for chest  pain, fatigue and palpitations Gastrointestinal: negative for abdominal pain and change in bowel habits Genitourinary: positive for heavy, irregular and painful periods.  Also c/o vaginal discharge Integument/breast: negative for nipple discharge Musculoskeletal:negative for myalgias Neurological: negative for gait problems and tremors Behavioral/Psych: negative for abusive relationship, depression Endocrine: negative for temperature intolerance      Blood pressure (!) 163/99, pulse 66, height 5\' 10"  (1.778 m), weight (!) 307 lb 11.2 oz (139.6 kg), last menstrual period 08/29/2023, unknown if currently breastfeeding.  Physical Exam Physical Exam General:   Alert and no distress  Skin:   no rash or abnormalities  Lungs:   clear to auscultation bilaterally  Heart:   regular rate and rhythm, S1, S2 normal, no murmur, click, rub or gallop  Breasts:   normal without suspicious masses, skin or nipple changes or axillary nodes  Abdomen:  normal findings: no organomegaly, soft, non-tender and no hernia  Pelvis:  External genitalia: normal general appearance Urinary system: urethral meatus normal and bladder without fullness, nontender Vaginal: normal without tenderness, induration or masses Cervix: normal appearance Adnexa: normal bimanual exam Uterus: anteverted and non-tender, normal size    I have spent a total of 30 minutes of face-to-face time, excluding clinical staff time, reviewing notes and preparing to see patient, ordering tests and/or medications, and counseling the  patient.   Data Reviewed CBC  Assessment     1. Abnormal uterine bleeding (AUB) (Primary) - therapeutic options discussed, ranging from Endometrial Ablation to Hysterectomy Rx: - US PELVIC COMPLETE WITH TRANSVAGINAL; Future - CBC - TSH - Comprehensive metabolic panel - Ferritin  2. Vaginal discharge Rx: - metroNIDAZOLE (FLAGYL) 500 MG tablet; Take 1 tablet (500 mg total) by mouth 2 (two) times daily.   Dispense: 14 tablet; Refill: 2 - fluconazole (DIFLUCAN) 200 MG tablet; Take 1 tablet (200 mg total) by mouth every 3 (three) days.  Dispense: 3 tablet; Refill: 2 - Cervicovaginal ancillary only  3. Screen for STD (sexually transmitted disease) Rx: - HIV Antibody (routine testing w rflx) - Hepatitis B surface antigen - RPR - Hepatitis C antibody  4. Dysmenorrhea Rx: - ibuprofen (ADVIL) 800 MG tablet; Take 1 tablet (800 mg total) by mouth every 8 (eight) hours as needed.  Dispense: 30 tablet; Refill: 5  5. Screening breast examination Rx: - MM 3D SCREENING MAMMOGRAM BILATERAL BREAST; Future  6. Screening for diabetes mellitus Rx: - Hemoglobin A1c  7. Class 3 severe obesity due to excess calories without serious comorbidity with body mass index (BMI) of 40.0 to 44.9 in adult (HCC) - weight reduction recommended with the aid of dietary changes, exercise and behavioral modification recommended  8. Routine adult health maintenance Rx: - Ambulatory referral to Internal Medicine     Plan - educational material dispensed in [facility]S on AUB and Endometrial Ablation, and she is interested in Endometrial Ablation   Follow up in 6 weeks  Orders Placed This Encounter  Procedures   MM 3D SCREENING MAMMOGRAM BILATERAL BREAST    Standing Status:   Future    Expiration Date:   09/01/2024    Reason for Exam (SYMPTOM  OR DIAGNOSIS REQUIRED):   AUB    Is the patient pregnant?:   No    Preferred imaging location?:   [facility]   US PELVIC COMPLETE WITH TRANSVAGINAL    Standing Status:   Future    Expiration Date:   09/01/2024    Reason for Exam (SYMPTOM  OR DIAGNOSIS REQUIRED):   AUB    Preferred imaging location?:   [facility]   CBC   TSH   Comprehensive metabolic panel   Ferritin   Hemoglobin A1c   HIV Antibody (routine testing w rflx)   Hepatitis B surface antigen   RPR   Hepatitis C antibody   Ambulatory referral to Internal Medicine    Referral Priority:   Routine     Referral Type:   Consultation    Referral Reason:   Specialty Services Required    Requested Specialty:   Internal Medicine    Number of Visits Requested:   1   Meds ordered this encounter  Medications   metroNIDAZOLE (FLAGYL) 500 MG tablet    Sig: Take 1 tablet (500 mg total) by mouth 2 (two) times daily.    Dispense:  14 tablet    Refill:  2   fluconazole (DIFLUCAN) 200 MG tablet    Sig: Take 1 tablet (200 mg total) by mouth every 3 (three) days.    Dispense:  3 tablet    Refill:  2   ibuprofen (ADVIL) 800 MG tablet    Sig: Take 1 tablet (800 mg total) by mouth every 8 (eight) hours as needed.    Dispense:  30 tablet    Refill:  5    Brock Bad, MD, FACOG Attending  Obstetrician & Gynecologist, [employer] for [employer], [facility], [place] 09/02/2023

## 2023-09-03 LAB — CBC
Hematocrit: 34.2 % (ref 34.0–46.6)
Hemoglobin: 10.7 g/dL — ABNORMAL LOW (ref 11.1–15.9)
MCH: 27.5 pg (ref 26.6–33.0)
MCHC: 31.3 g/dL — ABNORMAL LOW (ref 31.5–35.7)
MCV: 88 fL (ref 79–97)
Platelets: 401 10*3/uL (ref 150–450)
RBC: 3.89 x10E6/uL (ref 3.77–5.28)
RDW: 15.2 % (ref 11.7–15.4)
WBC: 7.9 10*3/uL (ref 3.4–10.8)

## 2023-09-03 LAB — COMPREHENSIVE METABOLIC PANEL
ALT: 9 IU/L (ref 0–32)
AST: 12 IU/L (ref 0–40)
Albumin: 3.8 g/dL — ABNORMAL LOW (ref 3.9–4.9)
Alkaline Phosphatase: 74 IU/L (ref 44–121)
BUN/Creatinine Ratio: 9 (ref 9–23)
BUN: 9 mg/dL (ref 6–24)
Bilirubin Total: 0.3 mg/dL (ref 0.0–1.2)
CO2: 23 mmol/L (ref 20–29)
Calcium: 8.8 mg/dL (ref 8.7–10.2)
Chloride: 105 mmol/L (ref 96–106)
Creatinine, Ser: 1.02 mg/dL — ABNORMAL HIGH (ref 0.57–1.00)
Globulin, Total: 2.6 g/dL (ref 1.5–4.5)
Glucose: 75 mg/dL (ref 70–99)
Potassium: 4.1 mmol/L (ref 3.5–5.2)
Sodium: 140 mmol/L (ref 134–144)
Total Protein: 6.4 g/dL (ref 6.0–8.5)
eGFR: 71 mL/min/{1.73_m2} (ref >59)

## 2023-09-03 LAB — HEMOGLOBIN A1C
Est. average glucose Bld gHb Est-mCnc: 126 mg/dL
Hgb A1c MFr Bld: 6 % — ABNORMAL HIGH (ref 4.8–5.6)

## 2023-09-03 LAB — HEPATITIS B SURFACE ANTIGEN: Hepatitis B Surface Ag: NEGATIVE

## 2023-09-03 LAB — FERRITIN: Ferritin: 7 ng/mL — ABNORMAL LOW (ref 15–150)

## 2023-09-03 LAB — RPR: RPR Ser Ql: NONREACTIVE

## 2023-09-03 LAB — HIV ANTIBODY (ROUTINE TESTING W REFLEX): HIV Screen 4th Generation wRfx: NONREACTIVE

## 2023-09-03 LAB — TSH: TSH: 1.02 u[IU]/mL (ref 0.450–4.500)

## 2023-09-03 LAB — HEPATITIS C ANTIBODY: Hep C Virus Ab: NONREACTIVE

## 2023-09-05 ENCOUNTER — Other Ambulatory Visit: Payer: Self-pay | Admitting: Obstetrics

## 2023-09-05 DIAGNOSIS — D508 Other iron deficiency anemias: Secondary | ICD-10-CM

## 2023-09-05 LAB — CERVICOVAGINAL ANCILLARY ONLY
Bacterial Vaginitis (gardnerella): NEGATIVE
Candida Glabrata: NEGATIVE
Candida Vaginitis: NEGATIVE
Chlamydia: NEGATIVE
Comment: NEGATIVE
Comment: NEGATIVE
Comment: NEGATIVE
Comment: NEGATIVE
Comment: NEGATIVE
Comment: NORMAL
Neisseria Gonorrhea: NEGATIVE
Trichomonas: NEGATIVE

## 2023-09-05 MED ORDER — ACCRUFER 30 MG PO CAPS: 1.0000 | ORAL_CAPSULE | Freq: Two times a day (BID) | ORAL | 3 refills | Status: AC

## 2023-09-16 ENCOUNTER — Ambulatory Visit (HOSPITAL_BASED_OUTPATIENT_CLINIC_OR_DEPARTMENT_OTHER)
Admission: RE | Admit: 2023-09-16 | Discharge: 2023-09-16 | Disposition: A | Discharge status: Home / Self Care | Source: Ambulatory Visit | Attending: Obstetrics | Admitting: Obstetrics

## 2023-09-16 DIAGNOSIS — N939 Abnormal uterine and vaginal bleeding, unspecified: Secondary | ICD-10-CM | POA: Diagnosis present

## 2023-10-04 ENCOUNTER — Ambulatory Visit
Admission: RE | Admit: 2023-10-04 | Discharge: 2023-10-04 | Disposition: A | Discharge status: Home / Self Care | Source: Ambulatory Visit | Attending: Obstetrics | Admitting: Obstetrics

## 2023-10-04 DIAGNOSIS — Z1239 Encounter for other screening for malignant neoplasm of breast: Secondary | ICD-10-CM

## 2024-03-19 ENCOUNTER — Ambulatory Visit: Admitting: Obstetrics and Gynecology

## 2024-04-06 ENCOUNTER — Ambulatory Visit: Admitting: Obstetrics and Gynecology

## 2024-05-15 ENCOUNTER — Encounter: Payer: Self-pay | Admitting: Obstetrics and Gynecology

## 2024-05-15 ENCOUNTER — Ambulatory Visit: Admitting: Obstetrics and Gynecology

## 2024-05-15 VITALS — BP 142/105 | HR 76 | Ht 70.0 in | Wt 312.0 lb

## 2024-05-15 DIAGNOSIS — N898 Other specified noninflammatory disorders of vagina: Secondary | ICD-10-CM

## 2024-05-15 DIAGNOSIS — N920 Excessive and frequent menstruation with regular cycle: Secondary | ICD-10-CM | POA: Diagnosis not present

## 2024-05-15 DIAGNOSIS — N946 Dysmenorrhea, unspecified: Secondary | ICD-10-CM

## 2024-05-15 MED ORDER — METRONIDAZOLE 500 MG PO TABS: 500.0000 mg | ORAL_TABLET | Freq: Two times a day (BID) | ORAL | 2 refills | Status: AC

## 2024-05-15 MED ORDER — IBUPROFEN 800 MG PO TABS: 800.0000 mg | ORAL_TABLET | Freq: Three times a day (TID) | ORAL | 5 refills | Status: AC | PRN

## 2024-05-15 MED ORDER — FLUCONAZOLE 200 MG PO TABS: 200.0000 mg | ORAL_TABLET | ORAL | 2 refills | Status: AC

## 2024-05-15 MED ORDER — TRANEXAMIC ACID 650 MG PO TABS: 1300.0000 mg | ORAL_TABLET | Freq: Three times a day (TID) | ORAL | 2 refills | Status: AC

## 2024-05-15 NOTE — Progress Notes (Signed)
 43 yo P2 with LMP 05/03/24 here to discuss surgical management of her menorrhagia with regular cycles. Patient reports her menses typically last 7 days and are heavy in flow. The past 2 months they have lasted 3 days which is more manageable for her. Patient is without any other complaints.   Past Medical History:  Diagnosis Date   Diabetes (HCC) 2004   Past Surgical History:  Procedure Laterality Date   ANKLE SURGERY     HIP SURGERY     LEG SURGERY     Family History  Problem Relation Age of Onset   Diabetes Mother    Hypertension Mother    Diabetes Father    Hypertension Father    BRCA 1/2 Neg Hx    Breast cancer Neg Hx    Social History   Tobacco Use   Smoking status: Some Days   Smokeless tobacco: Never   Tobacco comments:    Every few months  Vaping Use   Vaping status: Never Used  Substance Use Topics   Alcohol use: Not Currently    Comment: not since confirmed pregnancy   Drug use: No   ROS See pertinent in HPI. All other systems reviewed and non contributory Blood pressure (!) 142/105, pulse 76, height 5' 10 (1.778 m), weight (!) 312 lb (141.5 kg), last menstrual period 05/03/2024, unknown if currently breastfeeding. GENERAL: Well-developed, well-nourished female in no acute distress.  ABDOMEN: Soft, nontender, nondistended. No organomegaly. PELVIC: Normal external female genitalia. Vagina is pink and rugated.  Normal discharge. Normal appearing cervix. Uterus is normal in size. No adnexal mass or tenderness. Chaperone present during the pelvic exam EXTREMITIES: No cyanosis, clubbing, or edema, 2+ distal pulses.  2025 ultrasound FINDINGS: Uterus   Measurements: 11.2 x 6.6 x 8.6 cm = volume: 331.4 mL. No fibroids or other mass visualized.   Endometrium   Thickness: 17.8 mm.  No focal abnormality visualized.   Right ovary   Measurements: 4 x 3.9 x 3.3 cm = volume: 26.7 mL. Normal appearance/no adnexal mass.   Left ovary   Not visualized.   Other  findings   No abnormal free fluid.   IMPRESSION: No acute abnormality identified. Left ovary not visualized.   Endometrium measuring 17.8 mm. If bleeding remains unresponsive to hormonal or medical therapy, focal lesion work-up with sonohysterogram should be considered. Endometrial biopsy should also be considered in pre-menopausal patients at high risk for endometrial carcinoma. (Ref: Radiological Reasoning: Algorithmic Workup of Abnormal Vaginal Bleeding with Endovaginal Sonography and Sonohysterography. AJR 2008; [id number]:D31-26)     Electronically Signed   By: Craig Farr M.D.   On: 09/16/2023 10:38  A/P 43 yo P2 with menorrhagia and thickened endometrium - Discussed medical management of menorrhagia with TXA or surgical management with endometrial ablation vs hysterectomy - Patient desires to try TXA if her menses start lasting 7 days again - Patient with thickened endometrium on ultrasound- aware that she needs an endometrial biopsy. She is unable to give us  a urine sample. Patient plans to return for endometrial biopsy

## 2024-05-15 NOTE — Progress Notes (Addendum)
 43 y.o. GYN presents for surgical consultation of Hysterectomy.  Pt was unable to leave urine. She will return for ENDO Bx.

## 2024-06-01 NOTE — Addendum Note (Signed)
 Addended by: HERCHEL GRUMET A on: 06/01/2024 10:43 AM   Modules accepted: Level of Service
